# Patient Record
Sex: Female | Born: 1970 | Race: Black or African American | Hispanic: No | Marital: Single | Smoking: Never smoker
Health system: Southern US, Community
[De-identification: ages and names within clinical notes are randomized; demographics above are authoritative.]

---

## 1997-10-08 ENCOUNTER — Emergency Department (HOSPITAL_COMMUNITY): Admission: EM | Admit: 1997-10-08 | Discharge: 1997-10-09 | Payer: Self-pay | Admitting: Emergency Medicine

## 1998-01-29 ENCOUNTER — Other Ambulatory Visit: Admission: RE | Admit: 1998-01-29 | Discharge: 1998-01-29 | Payer: Self-pay | Admitting: Obstetrics and Gynecology

## 2001-03-15 ENCOUNTER — Other Ambulatory Visit: Admission: RE | Admit: 2001-03-15 | Discharge: 2001-03-15 | Payer: Self-pay | Admitting: Family Medicine

## 2001-03-17 ENCOUNTER — Encounter: Admission: RE | Admit: 2001-03-17 | Discharge: 2001-03-17 | Payer: Self-pay | Admitting: Family Medicine

## 2001-03-17 ENCOUNTER — Encounter: Payer: Self-pay | Admitting: Family Medicine

## 2002-04-08 ENCOUNTER — Encounter: Payer: Self-pay | Admitting: Ophthalmology

## 2002-04-08 ENCOUNTER — Encounter: Admission: RE | Admit: 2002-04-08 | Discharge: 2002-04-08 | Payer: Self-pay | Admitting: Ophthalmology

## 2002-06-01 ENCOUNTER — Ambulatory Visit (HOSPITAL_COMMUNITY): Admission: RE | Admit: 2002-06-01 | Discharge: 2002-06-01 | Payer: Self-pay | Admitting: Internal Medicine

## 2002-10-28 ENCOUNTER — Ambulatory Visit (HOSPITAL_COMMUNITY): Admission: RE | Admit: 2002-10-28 | Discharge: 2002-10-28 | Payer: Self-pay | Admitting: Internal Medicine

## 2003-03-16 ENCOUNTER — Other Ambulatory Visit: Admission: RE | Admit: 2003-03-16 | Discharge: 2003-03-16 | Payer: Self-pay | Admitting: Obstetrics and Gynecology

## 2003-03-21 ENCOUNTER — Encounter: Admission: RE | Admit: 2003-03-21 | Discharge: 2003-03-21 | Payer: Self-pay | Admitting: Obstetrics and Gynecology

## 2017-11-13 ENCOUNTER — Encounter: Payer: Self-pay | Admitting: Family

## 2017-11-13 ENCOUNTER — Ambulatory Visit (INDEPENDENT_AMBULATORY_CARE_PROVIDER_SITE_OTHER): Payer: BC Managed Care – PPO | Admitting: Family

## 2017-11-13 DIAGNOSIS — D509 Iron deficiency anemia, unspecified: Secondary | ICD-10-CM

## 2017-11-13 NOTE — Patient Instructions (Addendum)
Please consider scheduling follow-up with your eye doctor and dentist; please sign a records release from your GYN;  Health Maintenance, Female Adopting a healthy lifestyle and getting preventive care can go a long way to promote health and wellness. Talk with your health care provider about what schedule of regular examinations is right for you. This is a good chance for you to check in with your provider about disease prevention and staying healthy. In between checkups, there are plenty of things you can do on your own. Experts have done a lot of research about which lifestyle changes and preventive measures are most likely to keep you healthy. Ask your health care provider for more information. Weight and diet Eat a healthy diet  Be sure to include plenty of vegetables, fruits, low-fat dairy products, and lean protein.  Do not eat a lot of foods high in solid fats, added sugars, or salt.  Get regular exercise. This is one of the most important things you can do for your health. ? Most adults should exercise for at least 150 minutes each week. The exercise should increase your heart rate and make you sweat (moderate-intensity exercise). ? Most adults should also do strengthening exercises at least twice a week. This is in addition to the moderate-intensity exercise.  Maintain a healthy weight  Body mass index (BMI) is a measurement that can be used to identify possible weight problems. It estimates body fat based on height and weight. Your health care provider can help determine your BMI and help you achieve or maintain a healthy weight.  For females 22 years of age and older: ? A BMI below 18.5 is considered underweight. ? A BMI of 18.5 to 24.9 is normal. ? A BMI of 25 to 29.9 is considered overweight. ? A BMI of 30 and above is considered obese.  Watch levels of cholesterol and blood lipids  You should start having your blood tested for lipids and cholesterol at 49 years of age, then  have this test every 5 years.  You may need to have your cholesterol levels checked more often if: ? Your lipid or cholesterol levels are high. ? You are older than 49 years of age. ? You are at high risk for heart disease.  Cancer screening Lung Cancer  Lung cancer screening is recommended for adults 87-59 years old who are at high risk for lung cancer because of a history of smoking.  A yearly low-dose CT scan of the lungs is recommended for people who: ? Currently smoke. ? Have quit within the past 15 years. ? Have at least a 30-pack-year history of smoking. A pack year is smoking an average of one pack of cigarettes a day for 1 year.  Yearly screening should continue until it has been 15 years since you quit.  Yearly screening should stop if you develop a health problem that would prevent you from having lung cancer treatment.  Breast Cancer  Practice breast self-awareness. This means understanding how your breasts normally appear and feel.  It also means doing regular breast self-exams. Let your health care provider know about any changes, no matter how small.  If you are in your 20s or 30s, you should have a clinical breast exam (CBE) by a health care provider every 1-3 years as part of a regular health exam.  If you are 63 or older, have a CBE every year. Also consider having a breast X-ray (mammogram) every year.  If you have a family history  of breast cancer, talk to your health care provider about genetic screening.  If you are at high risk for breast cancer, talk to your health care provider about having an MRI and a mammogram every year.  Breast cancer gene (BRCA) assessment is recommended for women who have family members with BRCA-related cancers. BRCA-related cancers include: ? Breast. ? Ovarian. ? Tubal. ? Peritoneal cancers.  Results of the assessment will determine the need for genetic counseling and BRCA1 and BRCA2 testing.  Cervical Cancer Your health  care provider may recommend that you be screened regularly for cancer of the pelvic organs (ovaries, uterus, and vagina). This screening involves a pelvic examination, including checking for microscopic changes to the surface of your cervix (Pap test). You may be encouraged to have this screening done every 3 years, beginning at age 32.  For women ages 93-65, health care providers may recommend pelvic exams and Pap testing every 3 years, or they may recommend the Pap and pelvic exam, combined with testing for human papilloma virus (HPV), every 5 years. Some types of HPV increase your risk of cervical cancer. Testing for HPV may also be done on women of any age with unclear Pap test results.  Other health care providers may not recommend any screening for nonpregnant women who are considered low risk for pelvic cancer and who do not have symptoms. Ask your health care provider if a screening pelvic exam is right for you.  If you have had past treatment for cervical cancer or a condition that could lead to cancer, you need Pap tests and screening for cancer for at least 20 years after your treatment. If Pap tests have been discontinued, your risk factors (such as having a new sexual partner) need to be reassessed to determine if screening should resume. Some women have medical problems that increase the chance of getting cervical cancer. In these cases, your health care provider may recommend more frequent screening and Pap tests.  Colorectal Cancer  This type of cancer can be detected and often prevented.  Routine colorectal cancer screening usually begins at 49 years of age and continues through 49 years of age.  Your health care provider may recommend screening at an earlier age if you have risk factors for colon cancer.  Your health care provider may also recommend using home test kits to check for hidden blood in the stool.  A small camera at the end of a tube can be used to examine your colon  directly (sigmoidoscopy or colonoscopy). This is done to check for the earliest forms of colorectal cancer.  Routine screening usually begins at age 45.  Direct examination of the colon should be repeated every 5-10 years through 49 years of age. However, you may need to be screened more often if early forms of precancerous polyps or small growths are found.  Skin Cancer  Check your skin from head to toe regularly.  Tell your health care provider about any new moles or changes in moles, especially if there is a change in a mole's shape or color.  Also tell your health care provider if you have a mole that is larger than the size of a pencil eraser.  Always use sunscreen. Apply sunscreen liberally and repeatedly throughout the day.  Protect yourself by wearing long sleeves, pants, a wide-brimmed hat, and sunglasses whenever you are outside.  Heart disease, diabetes, and high blood pressure  High blood pressure causes heart disease and increases the risk of stroke. High  blood pressure is more likely to develop in: ? People who have blood pressure in the high end of the normal range (130-139/85-89 mm Hg). ? People who are overweight or obese. ? People who are African American.  If you are 90-2 years of age, have your blood pressure checked every 3-5 years. If you are 53 years of age or older, have your blood pressure checked every year. You should have your blood pressure measured twice-once when you are at a hospital or clinic, and once when you are not at a hospital or clinic. Record the average of the two measurements. To check your blood pressure when you are not at a hospital or clinic, you can use: ? An automated blood pressure machine at a pharmacy. ? A home blood pressure monitor.  If you are between 37 years and 1 years old, ask your health care provider if you should take aspirin to prevent strokes.  Have regular diabetes screenings. This involves taking a blood sample to  check your fasting blood sugar level. ? If you are at a normal weight and have a low risk for diabetes, have this test once every three years after 49 years of age. ? If you are overweight and have a high risk for diabetes, consider being tested at a younger age or more often. Preventing infection Hepatitis B  If you have a higher risk for hepatitis B, you should be screened for this virus. You are considered at high risk for hepatitis B if: ? You were born in a country where hepatitis B is common. Ask your health care provider which countries are considered high risk. ? Your parents were born in a high-risk country, and you have not been immunized against hepatitis B (hepatitis B vaccine). ? You have HIV or AIDS. ? You use needles to inject street drugs. ? You live with someone who has hepatitis B. ? You have had sex with someone who has hepatitis B. ? You get hemodialysis treatment. ? You take certain medicines for conditions, including cancer, organ transplantation, and autoimmune conditions.  Hepatitis C  Blood testing is recommended for: ? Everyone born from [birth year] through 1967. ? Anyone with known risk factors for hepatitis C.  Sexually transmitted infections (STIs)  You should be screened for sexually transmitted infections (STIs) including gonorrhea and chlamydia if: ? You are sexually active and are younger than 48 years of age. ? You are older than 49 years of age and your health care provider tells you that you are at risk for this type of infection. ? Your sexual activity has changed since you were last screened and you are at an increased risk for chlamydia or gonorrhea. Ask your health care provider if you are at risk.  If you do not have HIV, but are at risk, it may be recommended that you take a prescription medicine daily to prevent HIV infection. This is called pre-exposure prophylaxis (PrEP). You are considered at risk if: ? You are sexually active and do not regularly  use condoms or know the HIV status of your partner(s). ? You take drugs by injection. ? You are sexually active with a partner who has HIV.  Talk with your health care provider about whether you are at high risk of being infected with HIV. If you choose to begin PrEP, you should first be tested for HIV. You should then be tested every 3 months for as long as you are taking PrEP. Pregnancy  If you are  premenopausal and you may become pregnant, ask your health care provider about preconception counseling.  If you may become pregnant, take 400 to 800 micrograms (mcg) of folic acid every day.  If you want to prevent pregnancy, talk to your health care provider about birth control (contraception). Osteoporosis and menopause  Osteoporosis is a disease in which the bones lose minerals and strength with aging. This can result in serious bone fractures. Your risk for osteoporosis can be identified using a bone density scan.  If you are 23 years of age or older, or if you are at risk for osteoporosis and fractures, ask your health care provider if you should be screened.  Ask your health care provider whether you should take a calcium or vitamin D supplement to lower your risk for osteoporosis.  Menopause may have certain physical symptoms and risks.  Hormone replacement therapy may reduce some of these symptoms and risks. Talk to your health care provider about whether hormone replacement therapy is right for you. Follow these instructions at home:  Schedule regular health, dental, and eye exams.  Stay current with your immunizations.  Do not use any tobacco products including cigarettes, chewing tobacco, or electronic cigarettes.  If you are pregnant, do not drink alcohol.  If you are breastfeeding, limit how much and how often you drink alcohol.  Limit alcohol intake to no more than 1 drink per day for nonpregnant women. One drink equals 12 ounces of beer, 5 ounces of wine, or 1 ounces  of hard liquor.  Do not use street drugs.  Do not share needles.  Ask your health care provider for help if you need support or information about quitting drugs.  Tell your health care provider if you often feel depressed.  Tell your health care provider if you have ever been abused or do not feel safe at home. This information is not intended to replace advice given to you by your health care provider. Make sure you discuss any questions you have with your health care provider. Document Released: 11/04/2010 Document Revised: 09/27/2015 Document Reviewed: 01/23/2015 Elsevier Interactive Patient Education  [employer].

## 2017-11-13 NOTE — Progress Notes (Signed)
  Susan Morton is a 47 y.o. female with the following history as recorded in [facility]:  Patient Active Problem List   Diagnosis Date Noted  . Iron deficiency anemia 11/13/2017    No current outpatient medications on file.   No current facility-administered medications for this visit.     Allergies: Patient has no allergy information on record.  History reviewed. No pertinent past medical history.  History reviewed. No pertinent surgical history.  Family History  Problem Relation Age of Onset  . Heart disease Mother   . Hypertension Father     Social History   Tobacco Use  . Smoking status: Never Smoker  . Smokeless tobacco: Never Used  Substance Use Topics  . Alcohol use: Yes    Comment: 1 glass of wine per week    Subjective:  Patient presents today as a new patient. She is in her baseline state of health with no concerns. She recently had her CPE with her GYN who has been managing all of her healthcare needs. The GYN has been doing pap smears, mammogram and yearly labs. She will get those results forwarded for review but notes that she was told that all of her recent labs were fine. She notes her GYN told her that she needed to have a primary care provider in addition to her GYN. Patient defers any vaccines today; does not currently taking any type of medication; has been told to take iron in the past but is not currently taking her supplement. Notes that she has not had a period in almost 5 months and that her anemia had improved as a result with her last set of labs.  Admits she is overdue for dental and vision care; does not exercise regularly; denies any concerns for sleep problems;   Objective:  Vitals:   11/13/17 0953  BP: 138/86  Pulse: 91  Temp: 97.7 F (36.5 C)  TempSrc: Oral  SpO2: 99%  Weight: 207 lb 3.2 oz (94 kg)  Height: 5' (1.524 m)    General: Well developed, well nourished, in no acute distress  Skin : Warm and dry.  Head: Normocephalic and atraumatic   Lungs: Respirations unlabored; clear to auscultation bilaterally without wheeze, rales, rhonchi  [facility] exam: normal rate and regular rhythm.  Neurologic: Alert and oriented; speech intact; face symmetrical; moves all extremities well; CNII-XII intact without focal deficit  Assessment:  1. Iron deficiency anemia, unspecified iron deficiency anemia type     Plan:  Patient will have copies of her recent labs forwarded for review here; encouraged exercise/ weight loss; encouraged eye doctor and dental exams; she defers updating vaccines today; explained how PCP can work with her GYN to manage her healthcare needs.  No follow-ups on file.  No orders of the defined types were placed in this encounter.   Requested Prescriptions    No prescriptions requested or ordered in this encounter

## 2018-12-10 ENCOUNTER — Other Ambulatory Visit: Payer: Self-pay | Admitting: Family

## 2018-12-10 DIAGNOSIS — Z1231 Encounter for screening mammogram for malignant neoplasm of breast: Secondary | ICD-10-CM

## 2018-12-24 ENCOUNTER — Encounter: Payer: BC Managed Care – PPO | Admitting: Family

## 2019-02-04 ENCOUNTER — Ambulatory Visit: Payer: BC Managed Care – PPO

## 2019-08-22 ENCOUNTER — Other Ambulatory Visit: Payer: Self-pay

## 2019-08-22 ENCOUNTER — Encounter: Payer: Self-pay | Admitting: Family

## 2019-08-22 ENCOUNTER — Ambulatory Visit (INDEPENDENT_AMBULATORY_CARE_PROVIDER_SITE_OTHER): Payer: BC Managed Care – PPO | Admitting: Family

## 2019-08-22 VITALS — BP 138/102 | HR 107 | Temp 98.8°F | Wt 222.0 lb

## 2019-08-22 DIAGNOSIS — R030 Elevated blood-pressure reading, without diagnosis of hypertension: Secondary | ICD-10-CM

## 2019-08-22 DIAGNOSIS — L739 Follicular disorder, unspecified: Secondary | ICD-10-CM | POA: Diagnosis not present

## 2019-08-22 MED ORDER — DOXYCYCLINE HYCLATE 100 MG PO TABS: 100.0000 mg | ORAL_TABLET | Freq: Two times a day (BID) | ORAL | 0 refills | Status: DC

## 2019-08-22 NOTE — Patient Instructions (Signed)
Please apply warm/ moist compresses ( warm washcloth) to your right armpit 4 x per day as tolerated; this will help the area open up; please take all the antibiotics as prescribed;  Please start checking your blood pressure- try to check it 2-3 x per week for the next 2 weeks; please follow-up to re-check/ discuss in 2 weeks;   Folliculitis  Folliculitis is inflammation of the hair follicles. Folliculitis most commonly occurs on the scalp, thighs, legs, back, and buttocks. However, it can occur anywhere on the body. What are the causes? This condition may be caused by:  A bacterial infection (common).  A fungal infection.  A viral infection.  Contact with certain chemicals, especially oils and tars.  Shaving or waxing.  Greasy ointments or creams applied to the skin. Long-lasting folliculitis and folliculitis that keeps coming back may be caused by bacteria. This bacteria can live anywhere on your skin and is often found in the nostrils. What increases the risk? You are more likely to develop this condition if you have:  A weakened immune system.  Diabetes.  Obesity. What are the signs or symptoms? Symptoms of this condition include:  Redness.  Soreness.  Swelling.  Itching.  Small white or yellow, pus-filled, itchy spots (pustules) that appear over a reddened area. If there is an infection that goes deep into the follicle, these may develop into a boil (furuncle).  A group of closely packed boils (carbuncle). These tend to form in hairy, sweaty areas of the body. How is this diagnosed? This condition is diagnosed with a skin exam. To find what is causing the condition, your health care provider may take a sample of one of the pustules or boils for testing in a lab. How is this treated? This condition may be treated by:  Applying warm compresses to the affected areas.  Taking an antibiotic medicine or applying an antibiotic medicine to the skin.  Applying or  bathing with an antiseptic solution.  Taking an over-the-counter medicine to help with itching.  Having a procedure to drain any pustules or boils. This may be done if a pustule or boil contains a lot of pus or fluid.  Having laser hair removal. This may be done to treat long-lasting folliculitis. Follow these instructions at home: Managing pain and swelling   If directed, apply heat to the affected area as often as told by your health care provider. Use the heat source that your health care provider recommends, such as a moist heat pack or a heating pad. ? Place a towel between your skin and the heat source. ? Leave the heat on for 20-30 minutes. ? Remove the heat if your skin turns bright red. This is especially important if you are unable to feel pain, heat, or cold. You may have a greater risk of getting burned. General instructions  If you were prescribed an antibiotic medicine, take it or apply it as told by your health care provider. Do not stop using the antibiotic even if your condition improves.  Check the irritated area every day for signs of infection. Check for: ? Redness, swelling, or pain. ? Fluid or blood. ? Warmth. ? Pus or a bad smell.  Do not shave irritated skin.  Take over-the-counter and prescription medicines only as told by your health care provider.  Keep all follow-up visits as told by your health care provider. This is important. Get help right away if:  You have more redness, swelling, or pain in the affected  area.  Red streaks are spreading from the affected area.  You have a fever. Summary  Folliculitis is inflammation of the hair follicles. Folliculitis most commonly occurs on the scalp, thighs, legs, back, and buttocks.  This condition may be treated by taking an antibiotic medicine or applying an antibiotic medicine to the skin, and applying or bathing with an antiseptic solution.  If you were prescribed an antibiotic medicine, take it or  apply it as told by your health care provider. Do not stop using the antibiotic even if your condition improves.  Get help right away if you have new or worsening symptoms.  Keep all follow-up visits as told by your health care provider. This is important. This information is not intended to replace advice given to you by your health care provider. Make sure you discuss any questions you have with your health care provider. Document Revised: 11/28/2017 Document Reviewed: 11/28/2017 Elsevier Patient Education  [place].

## 2019-08-22 NOTE — Progress Notes (Signed)
  Susan Morton is a 49 y.o. female with the following history as recorded in Epic[facility]:  Patient Active Problem List   Diagnosis Date Noted  . Iron deficiency anemia 11/13/2017    Current Outpatient Medications  Medication Sig Dispense Refill  . doxycycline (VIBRA-TABS) 100 MG tablet Take 1 tablet (100 mg total) by mouth 2 (two) times daily. 20 tablet 0   No current facility-administered medications for this visit.    Allergies: Patient has no known allergies.  No past medical history on file.  No past surgical history on file.  Family History  Problem Relation Age of Onset  . Heart disease Mother   . Hypertension Father     Social History   Tobacco Use  . Smoking status: Never Smoker  . Smokeless tobacco: Never Used  Substance Use Topics  . Alcohol use: Yes    Comment: 1 glass of wine per week    Subjective:  Patient has been seen here once in 2019; majority of her healthcare needs are managed by her GYN; Notes that she shaved under her right armpit last week- became concerned when area of concern seemed to get larger over the weekend; is up to date on mammogram- normal mammogram was done in October  2020 with her GYN;   Has gained 15 pounds since OV in 2019- no prior history of hypertension; patient feels like her blood pressure is elevated today due to rushing to get here from work;   Objective:  Vitals:   08/22/19 1425  BP: (!) 138/102  Pulse: (!) 107  Temp: 98.8 F (37.1 C)  TempSrc: Oral  SpO2: 95%  Weight: 222 lb (100.7 kg)    General: Well developed, well nourished, in no acute distress  Skin : Warm and dry. Localized area of folliculitis in right axillary region Head: Normocephalic and atraumatic  Lungs: Respirations unlabored;  Neurologic: Alert and oriented; speech intact; face symmetrical; moves all extremities well; CNII-XII intact without focal deficit  Breast exam is normal;  Assessment:  1. Folliculitis of right axilla   2. Elevated blood pressure  reading     Plan:  1. Reassurance- do not think diagnostic mammogram is needed; encouraged to apply warm compresses up to 4 x per day; Rx for Doxycycline 100 mg bid x 10 days; follow-up worse, no better. 2. Patient to start checking her blood pressure regularly- follow-up in 2 weeks and bring readings for review; may need to start medication;  This visit occurred during the SARS-CoV-2 public health emergency.  Safety protocols were in place, including screening questions prior to the visit, additional usage of staff PPE, and extensive cleaning of exam room while observing appropriate contact time as indicated for disinfecting solutions.     No follow-ups on file.  No orders of the defined types were placed in this encounter.   Requested Prescriptions   Signed Prescriptions Disp Refills  . doxycycline (VIBRA-TABS) 100 MG tablet 20 tablet 0    Sig: Take 1 tablet (100 mg total) by mouth 2 (two) times daily.

## 2019-09-05 ENCOUNTER — Ambulatory Visit (INDEPENDENT_AMBULATORY_CARE_PROVIDER_SITE_OTHER): Payer: BC Managed Care – PPO | Admitting: Family

## 2019-09-05 ENCOUNTER — Encounter: Payer: Self-pay | Admitting: Family

## 2019-09-05 ENCOUNTER — Other Ambulatory Visit: Payer: Self-pay

## 2019-09-05 VITALS — BP 162/104 | HR 97 | Temp 98.7°F | Ht 60.0 in | Wt 223.8 lb

## 2019-09-05 DIAGNOSIS — Z862 Personal history of diseases of the blood and blood-forming organs and certain disorders involving the immune mechanism: Secondary | ICD-10-CM | POA: Diagnosis not present

## 2019-09-05 DIAGNOSIS — D509 Iron deficiency anemia, unspecified: Secondary | ICD-10-CM

## 2019-09-05 DIAGNOSIS — I10 Essential (primary) hypertension: Secondary | ICD-10-CM | POA: Diagnosis not present

## 2019-09-05 MED ORDER — AMLODIPINE BESYLATE 5 MG PO TABS: 5.0000 mg | ORAL_TABLET | Freq: Every day | ORAL | 0 refills | Status: DC

## 2019-09-05 NOTE — Patient Instructions (Signed)

## 2019-09-05 NOTE — Progress Notes (Signed)
  Susan Morton is a 49 y.o. female with the following history as recorded in EpicCare:  Patient Active Problem List   Diagnosis Date Noted  . Iron deficiency anemia 11/13/2017    Current Outpatient Medications  Medication Sig Dispense Refill  . amLODipine (NORVASC) 5 MG tablet Take 1 tablet (5 mg total) by mouth daily. 30 tablet 0   No current facility-administered medications for this visit.    Allergies: Patient has no known allergies.  History reviewed. No pertinent past medical history.  History reviewed. No pertinent surgical history.  Family History  Problem Relation Age of Onset  . Heart disease Mother   . Hypertension Father     Social History   Tobacco Use  . Smoking status: Never Smoker  . Smokeless tobacco: Never Used  Substance Use Topics  . Alcohol use: Yes    Comment: 1 glass of wine per week    Subjective:  2 week follow up on elevated blood pressure; has not been checking as discussed at last office visit; has been told by her GYN that pressure has been elevated in the past; strong FH of HTN; Denies any chest pain, shortness of breath, blurred vision or headache   Objective:  Vitals:   09/05/19 1559 09/05/19 1602  BP: (!) 160/100 (!) 162/104  Pulse: 97   Temp: 98.7 F (37.1 C)   TempSrc: Oral   SpO2: 98%   Weight: 223 lb 12.8 oz (101.5 kg)   Height: 5' (1.524 m)     General: Well developed, well nourished, in no acute distress  Skin : Warm and dry.  Head: Normocephalic and atraumatic  Lungs: Respirations unlabored; clear to auscultation bilaterally without wheeze, rales, rhonchi  CVS exam: normal rate and regular rhythm.  Neurologic: Alert and oriented; speech intact; face symmetrical; moves all extremities well; CNII-XII intact without focal deficit   Assessment:  1. Essential hypertension   2. Personal history of sarcoidosis   3. Iron deficiency anemia, unspecified iron deficiency anemia type     Plan:  1. Start Amlodipine 5 mg daily; risks/  benefits of medication discussed; written information provided; plan to update labs at next OV; follow-up in 2 weeks; 2. Patient will try to find old records to see who she may have been working with in the past; at next OV, plan to update CXR and ACE level; 3. Will update anemia panel- this has been managed by her GYN previously;  This visit occurred during the SARS-CoV-2 public health emergency.  Safety protocols were in place, including screening questions prior to the visit, additional usage of staff PPE, and extensive cleaning of exam room while observing appropriate contact time as indicated for disinfecting solutions.     No follow-ups on file.  No orders of the defined types were placed in this encounter.   Requested Prescriptions   Signed Prescriptions Disp Refills  . amLODipine (NORVASC) 5 MG tablet 30 tablet 0    Sig: Take 1 tablet (5 mg total) by mouth daily.

## 2019-09-19 ENCOUNTER — Ambulatory Visit: Payer: BC Managed Care – PPO | Admitting: Family

## 2019-09-19 ENCOUNTER — Encounter: Payer: Self-pay | Admitting: Family

## 2019-09-19 ENCOUNTER — Other Ambulatory Visit: Payer: Self-pay

## 2019-09-19 VITALS — BP 140/88 | HR 94 | Temp 98.0°F | Ht 60.0 in | Wt 222.2 lb

## 2019-09-19 DIAGNOSIS — D509 Iron deficiency anemia, unspecified: Secondary | ICD-10-CM | POA: Diagnosis not present

## 2019-09-19 DIAGNOSIS — E569 Vitamin deficiency, unspecified: Secondary | ICD-10-CM

## 2019-09-19 DIAGNOSIS — R5383 Other fatigue: Secondary | ICD-10-CM | POA: Diagnosis not present

## 2019-09-19 DIAGNOSIS — D869 Sarcoidosis, unspecified: Secondary | ICD-10-CM

## 2019-09-19 DIAGNOSIS — Z13220 Encounter for screening for lipoid disorders: Secondary | ICD-10-CM

## 2019-09-19 DIAGNOSIS — I10 Essential (primary) hypertension: Secondary | ICD-10-CM

## 2019-09-19 LAB — CBC WITH DIFFERENTIAL/PLATELET
Basophils Absolute: 0 10*3/uL (ref 0.0–0.1)
Basophils Relative: 1 % (ref 0.0–3.0)
Eosinophils Absolute: 0.1 10*3/uL (ref 0.0–0.7)
Eosinophils Relative: 2.2 % (ref 0.0–5.0)
HCT: 42 % (ref 36.0–46.0)
Hemoglobin: 14 g/dL (ref 12.0–15.0)
Lymphocytes Relative: 36.2 % (ref 12.0–46.0)
Lymphs Abs: 1.5 10*3/uL (ref 0.7–4.0)
MCHC: 33.4 g/dL (ref 30.0–36.0)
MCV: 92.7 fl (ref 78.0–100.0)
Monocytes Absolute: 0.3 10*3/uL (ref 0.1–1.0)
Monocytes Relative: 6.9 % (ref 3.0–12.0)
Neutro Abs: 2.3 10*3/uL (ref 1.4–7.7)
Neutrophils Relative %: 53.7 % (ref 43.0–77.0)
Platelets: 287 10*3/uL (ref 150.0–400.0)
RBC: 4.53 Mil/uL (ref 3.87–5.11)
RDW: 13.8 % (ref 11.5–15.5)
WBC: 4.2 10*3/uL (ref 4.0–10.5)

## 2019-09-19 LAB — COMPREHENSIVE METABOLIC PANEL
ALT: 11 U/L (ref 0–35)
AST: 18 U/L (ref 0–37)
Albumin: 4.3 g/dL (ref 3.5–5.2)
Alkaline Phosphatase: 71 U/L (ref 39–117)
BUN: 20 mg/dL (ref 6–23)
CO2: 27 mEq/L (ref 19–32)
Calcium: 9.3 mg/dL (ref 8.4–10.5)
Chloride: 103 mEq/L (ref 96–112)
Creatinine, Ser: 0.72 mg/dL (ref 0.40–1.20)
GFR: 103.26 mL/min (ref >60.00)
Glucose, Bld: 97 mg/dL (ref 70–99)
Potassium: 3.9 mEq/L (ref 3.5–5.1)
Sodium: 136 mEq/L (ref 135–145)
Total Bilirubin: 0.4 mg/dL (ref 0.2–1.2)
Total Protein: 8.4 g/dL — ABNORMAL HIGH (ref 6.0–8.3)

## 2019-09-19 LAB — LIPID PANEL
Cholesterol: 219 mg/dL — ABNORMAL HIGH (ref 0–200)
HDL: 55.3 mg/dL (ref >39.00)
LDL Cholesterol: 142 mg/dL — ABNORMAL HIGH (ref 0–99)
NonHDL: 163.52
Total CHOL/HDL Ratio: 4
Triglycerides: 108 mg/dL (ref 0.0–149.0)
VLDL: 21.6 mg/dL (ref 0.0–40.0)

## 2019-09-19 LAB — SEDIMENTATION RATE: Sed Rate: 32 mm/hr — ABNORMAL HIGH (ref 0–30)

## 2019-09-19 MED ORDER — AMLODIPINE BESYLATE 10 MG PO TABS: 10.0000 mg | ORAL_TABLET | Freq: Every day | ORAL | 2 refills | Status: DC

## 2019-09-19 NOTE — Progress Notes (Signed)
Susan Morton is a 49 y.o. female with the following history as recorded in EpicCare:  Patient Active Problem List   Diagnosis Date Noted  . Iron deficiency anemia 11/13/2017    Current Outpatient Medications  Medication Sig Dispense Refill  . amLODipine (NORVASC) 5 MG tablet Take 1 tablet (5 mg total) by mouth daily. 30 tablet 0  . amLODipine (NORVASC) 10 MG tablet Take 1 tablet (10 mg total) by mouth daily. 30 tablet 2   No current facility-administered medications for this visit.    Allergies: Patient has no known allergies.  History reviewed. No pertinent past medical history.  History reviewed. No pertinent surgical history.  Family History  Problem Relation Age of Onset  . Heart disease Mother   . Hypertension Father     Social History   Tobacco Use  . Smoking status: Never Smoker  . Smokeless tobacco: Never Used  Substance Use Topics  . Alcohol use: Yes    Comment: 1 glass of wine per week    Subjective:  Follow-up on recent start of Amlodipine 5 mg; tolerating well and is "feeling so much better." Has noticed increased energy and sleeping better; Denies any chest pain, shortness of breath, blurred vision or headache Did find old records regarding the diagnosis of sarcoidosis; was originally discussed in relation to iritis diagnosis in 2003; she notes that she felt much better and opted not to continue regular follow-ups; Has also been told by her GYN that she is anemic at previous check-ups; overdue for baseline colon cancer screening;  Objective:  Vitals:   09/19/19 1544  BP: 140/88  Pulse: 94  Temp: 98 F (36.7 C)  TempSrc: Oral  SpO2: 99%  Weight: 222 lb 3.2 oz (100.8 kg)  Height: 5' (1.524 m)    General: Well developed, well nourished, in no acute distress  Skin : Warm and dry.  Head: Normocephalic and atraumatic  Lungs: Respirations unlabored; clear to auscultation bilaterally without wheeze, rales, rhonchi  CVS exam: normal rate and regular rhythm.   Neurologic: Alert and oriented; speech intact; face symmetrical; moves all extremities well; CNII-XII intact without focal deficit   Assessment:  1. Essential hypertension   2. Iron deficiency anemia, unspecified iron deficiency anemia type   3. Lipid screening   4. Other fatigue   5. Vitamin deficiency   6. Sarcoidosis     Plan:  1. Improved; increase Amlodipine to 10 mg daily; re-check in 1 month; 2. Check iron panel today; need to discuss colon cancer screening at next OV; 3. Check lipid panel today; 4. Check TSH today; 5. Check Vitamin D level today; 6. Check ACE level, ANA, sed rate, RF today; will get CXR at next OV as our machine is not working today;  This visit occurred during the SARS-CoV-2 public health emergency.  Safety protocols were in place, including screening questions prior to the visit, additional usage of staff PPE, and extensive cleaning of exam room while observing appropriate contact time as indicated for disinfecting solutions.     Return in about 1 month (around 10/20/2019) for needs Friday appointment.  Orders Placed This Encounter  Procedures  . CBC with Differential/Platelet  . Comp Met (CMET)  . Iron, TIBC and Ferritin Panel  . Lipid panel  . TSH  . Vitamin D (25 hydroxy)  . Antinuclear Antib (ANA)  . Sedimentation rate  . Rheumatoid Factor  . Angiotensin converting enzyme    Requested Prescriptions   Signed Prescriptions Disp Refills  .  amLODipine (NORVASC) 10 MG tablet 30 tablet 2    Sig: Take 1 tablet (10 mg total) by mouth daily.

## 2019-09-20 LAB — VITAMIN D 25 HYDROXY (VIT D DEFICIENCY, FRACTURES): VITD: 24.6 ng/mL — ABNORMAL LOW (ref 30.00–100.00)

## 2019-09-20 LAB — TSH: TSH: 1.37 u[IU]/mL (ref 0.35–4.50)

## 2019-09-22 LAB — ANGIOTENSIN CONVERTING ENZYME: Angiotensin-Converting Enzyme: 45 U/L (ref 9–67)

## 2019-09-22 LAB — EXTRA LAV TOP TUBE

## 2019-09-22 LAB — ANTI-NUCLEAR AB-TITER (ANA TITER)
ANA TITER: 1:80 {titer} — ABNORMAL HIGH
ANA Titer 1: >=1:1280 {titer} — AB

## 2019-09-22 LAB — RHEUMATOID FACTOR: Rheumatoid fact SerPl-aCnc: <14 IU/mL (ref <14)

## 2019-09-22 LAB — IRON,TIBC AND FERRITIN PANEL
%SAT: 13 % (calc) — ABNORMAL LOW (ref 16–45)
Ferritin: 14 ng/mL — ABNORMAL LOW (ref 16–232)
Iron: 64 ug/dL (ref 45–160)
TIBC: 499 mcg/dL (calc) — ABNORMAL HIGH (ref 250–450)

## 2019-09-22 LAB — ANA: Anti Nuclear Antibody (ANA): POSITIVE — AB

## 2019-09-23 ENCOUNTER — Other Ambulatory Visit: Payer: Self-pay | Admitting: Family

## 2019-09-23 DIAGNOSIS — R768 Other specified abnormal immunological findings in serum: Secondary | ICD-10-CM

## 2019-09-23 DIAGNOSIS — D869 Sarcoidosis, unspecified: Secondary | ICD-10-CM

## 2019-09-23 MED ORDER — VITAMIN D (ERGOCALCIFEROL) 1.25 MG (50000 UNIT) PO CAPS: 50000.0000 [IU] | ORAL_CAPSULE | ORAL | 0 refills | Status: AC

## 2019-09-27 ENCOUNTER — Other Ambulatory Visit: Payer: Self-pay | Admitting: Family

## 2019-10-21 ENCOUNTER — Other Ambulatory Visit: Payer: Self-pay

## 2019-10-21 ENCOUNTER — Ambulatory Visit (INDEPENDENT_AMBULATORY_CARE_PROVIDER_SITE_OTHER): Payer: BC Managed Care – PPO | Admitting: Family

## 2019-10-21 ENCOUNTER — Encounter: Payer: Self-pay | Admitting: Gastroenterology

## 2019-10-21 ENCOUNTER — Encounter: Payer: Self-pay | Admitting: Family

## 2019-10-21 VITALS — BP 138/84 | HR 89 | Temp 98.2°F | Ht 60.0 in | Wt 220.0 lb

## 2019-10-21 DIAGNOSIS — D869 Sarcoidosis, unspecified: Secondary | ICD-10-CM

## 2019-10-21 DIAGNOSIS — Z1211 Encounter for screening for malignant neoplasm of colon: Secondary | ICD-10-CM

## 2019-10-21 DIAGNOSIS — I10 Essential (primary) hypertension: Secondary | ICD-10-CM

## 2019-10-21 NOTE — Progress Notes (Signed)
  Susan Morton is a 49 y.o. female with the following history as recorded in EpicCare:  Patient Active Problem List   Diagnosis Date Noted  . Iron deficiency anemia 11/13/2017    Current Outpatient Medications  Medication Sig Dispense Refill  . amLODipine (NORVASC) 10 MG tablet Take 1 tablet (10 mg total) by mouth daily. 30 tablet 2  . Vitamin D, Ergocalciferol, (DRISDOL) 1.25 MG (50000 UNIT) CAPS capsule Take 1 capsule (50,000 Units total) by mouth every 7 (seven) days for 12 doses. 12 capsule 0   No current facility-administered medications for this visit.    Allergies: Patient has no known allergies.  History reviewed. No pertinent past medical history.  History reviewed. No pertinent surgical history.  Family History  Problem Relation Age of Onset  . Heart disease Mother   . Hypertension Father     Social History   Tobacco Use  . Smoking status: Never Smoker  . Smokeless tobacco: Never Used  Substance Use Topics  . Alcohol use: Yes    Comment: 1 glass of wine per week    Subjective:  1 month follow-up on hypertension; feeling very good since she was started on Amlodipine; at last OV, dosage was increased to 10 mg; Denies any chest pain, shortness of breath, blurred vision or headache    Objective:  Vitals:   10/21/19 0920  BP: 138/84  Pulse: 89  Temp: 98.2 F (36.8 C)  TempSrc: Oral  SpO2: 96%  Weight: 220 lb (99.8 kg)  Height: 5' (1.524 m)    General: Well developed, well nourished, in no acute distress  Skin : Warm and dry.  Head: Normocephalic and atraumatic  Lungs: Respirations unlabored; clear to auscultation bilaterally without wheeze, rales, rhonchi  CVS exam: normal rate and regular rhythm.  Musculoskeletal: No deformities; no active joint inflammation  Extremities: No edema, cyanosis, clubbing  Vessels: Symmetric bilaterally  Neurologic: Alert and oriented; speech intact; face symmetrical; moves all extremities well; CNII-XII intact without focal  deficit   Assessment:  1. Essential hypertension   2. Encounter for screening colonoscopy   3. Sarcoidosis     Plan:  1. Good improvement; continue Amlodipine 10 mg daily; follow-up in 4 months; 2. Referral updated; 3. Reviewed recent labs- keep planned rheumatology evaluation; may need pulmonology consult as well; follow-up to be determined;  Discussed updating vaccines and patient notes she does not like vaccines;  This visit occurred during the SARS-CoV-2 public health emergency.  Safety protocols were in place, including screening questions prior to the visit, additional usage of staff PPE, and extensive cleaning of exam room while observing appropriate contact time as indicated for disinfecting solutions.     Return in about 4 months (around 02/20/2020).  Orders Placed This Encounter  Procedures  . Ambulatory referral to Gastroenterology    Referral Priority:   Routine    Referral Type:   Consultation    Referral Reason:   Specialty Services Required    Number of Visits Requested:   1    Requested Prescriptions    No prescriptions requested or ordered in this encounter

## 2019-12-12 ENCOUNTER — Other Ambulatory Visit: Payer: Self-pay | Admitting: Family

## 2019-12-18 ENCOUNTER — Other Ambulatory Visit: Payer: Self-pay | Admitting: Family

## 2019-12-23 ENCOUNTER — Encounter: Payer: BC Managed Care – PPO | Admitting: Gastroenterology

## 2020-01-20 ENCOUNTER — Ambulatory Visit: Payer: BC Managed Care – PPO | Admitting: Rheumatology

## 2020-02-17 ENCOUNTER — Ambulatory Visit: Payer: BC Managed Care – PPO | Admitting: Rheumatology

## 2020-02-20 ENCOUNTER — Other Ambulatory Visit: Payer: Self-pay

## 2020-02-20 ENCOUNTER — Encounter: Payer: Self-pay | Admitting: Family

## 2020-02-20 ENCOUNTER — Ambulatory Visit (INDEPENDENT_AMBULATORY_CARE_PROVIDER_SITE_OTHER): Payer: BC Managed Care – PPO | Admitting: Family

## 2020-02-20 VITALS — BP 150/98 | HR 90 | Temp 98.4°F | Ht 60.0 in | Wt 219.8 lb

## 2020-02-20 DIAGNOSIS — D869 Sarcoidosis, unspecified: Secondary | ICD-10-CM

## 2020-02-20 DIAGNOSIS — Z1211 Encounter for screening for malignant neoplasm of colon: Secondary | ICD-10-CM

## 2020-02-20 DIAGNOSIS — I10 Essential (primary) hypertension: Secondary | ICD-10-CM

## 2020-02-20 MED ORDER — AMLODIPINE BESYLATE 10 MG PO TABS: 10.0000 mg | ORAL_TABLET | Freq: Every day | ORAL | 3 refills | Status: DC

## 2020-02-20 NOTE — Progress Notes (Signed)
  Susan Morton is a 49 y.o. female with the following history as recorded in EpicCare:  Patient Active Problem List   Diagnosis Date Noted  . Iron deficiency anemia 11/13/2017    Current Outpatient Medications  Medication Sig Dispense Refill  . amLODipine (NORVASC) 10 MG tablet Take 1 tablet (10 mg total) by mouth daily. 90 tablet 3   No current facility-administered medications for this visit.    Allergies: Patient has no known allergies.  History reviewed. No pertinent past medical history.  History reviewed. No pertinent surgical history.  Family History  Problem Relation Age of Onset  . Heart disease Mother   . Hypertension Father     Social History   Tobacco Use  . Smoking status: Never Smoker  . Smokeless tobacco: Never Used  Substance Use Topics  . Alcohol use: Yes    Comment: 1 glass of wine per week    Subjective:  4 month follow-up on hypertension; patient notes she was feeling better so she opted to stop her blood pressure medication; has not been checking regularly; Denies any chest pain, shortness of breath, blurred vision or headache Planning to see her GYN next week for CPE/ mammogram/ pap smear;   Objective:  Vitals:   02/20/20 1513  BP: (!) 150/98  Pulse: 90  Temp: 98.4 F (36.9 C)  TempSrc: Oral  SpO2: 98%  Weight: 219 lb 12.8 oz (99.7 kg)  Height: 5' (1.524 m)    General: Well developed, well nourished, in no acute distress  Head: Normocephalic and atraumatic  Eyes: Sclera and conjunctiva clear; pupils round and reactive to light; extraocular movements intact  Ears: External normal; canals clear; tympanic membranes normal  Oropharynx: Pink, supple. No suspicious lesions  Neck: Supple without thyromegaly, adenopathy  Lungs: Respirations unlabored; clear to auscultation bilaterally without wheeze, rales, rhonchi  CVS exam: normal rate and regular rhythm.  Neurologic: Alert and oriented; speech intact; face symmetrical; moves all extremities well;  CNII-XII intact without focal deficit   Assessment:  1. Essential hypertension   2. Sarcoidosis   3. Colon cancer screening     Plan:  1. Re-start blood pressure medication; stressed need to take it daily;  2. Stressed importance of keeping upcoming appointment with rheumatology; may also need to see pulmonology; 3. Patient agrees to Cologuard as alternative to colonoscopy;   She defers flu shot/ is considering COVID vaccine and understands she can get at her pharmacy;  Plan for follow-up here in 4-6 months;  This visit occurred during the SARS-CoV-2 public health emergency.  Safety protocols were in place, including screening questions prior to the visit, additional usage of staff PPE, and extensive cleaning of exam room while observing appropriate contact time as indicated for disinfecting solutions.     No follow-ups on file.  No orders of the defined types were placed in this encounter.   Requested Prescriptions   Signed Prescriptions Disp Refills  . amLODipine (NORVASC) 10 MG tablet 90 tablet 3    Sig: Take 1 tablet (10 mg total) by mouth daily.

## 2020-04-09 LAB — COLOGUARD: COLOGUARD: NEGATIVE

## 2020-04-11 NOTE — Progress Notes (Signed)
Office Visit Note  Patient: Susan Morton             Date of Birth: 1970/07/23           MRN: [id number]             PCP: Marrian Salvage, FNP Referring: Marrian Salvage,* Visit Date: 04/25/2020 Occupation: _0 @  Subjective:  +ANA.   History of Present Illness: Susan Morton is a 49 y.o. female seen in consultation per request of her PCP for evaluation of positive ANA.  According to the patient in 2003 she developed an episode of iritis in her right eye and right knee joint pain.  At that time her ophthalmologist did work-up including a chest x-ray which showed hilar lymphadenopathy.  She also had a CT scan of the chest which showed mediastinal and hilar lymphadenopathy with suspicion of sarcoidosis.  She states she did not see a pulmonologist and never developed any symptoms.  She states even iritis was not treated and it resolved by itself.  She has had no recurrence of symptoms since 2003.  She denies any joint pain or joint swelling now.  She states that she went for a physical and her PCP did lab work which showed positive ANA for that reason she was referred to me.  There is no history of oral ulcers, nasal ulcers, sicca symptoms, malar rash, Raynaud's phenomenon, rash, photosensitivity or lymphadenopathy.  She denies any history of  joint swelling.  He complains of intermittent pain in her right first MTP joint.  She states the pain is not severe it usually occurs when she is walking.  There is no family history of autoimmune disease.  She is gravida 2, para 2.  Activities of Daily Living:  Patient reports morning stiffness for 0 minutes.   Patient Denies nocturnal pain.  Difficulty dressing/grooming: Denies Difficulty climbing stairs: Reports Difficulty getting out of chair: Denies Difficulty using hands for taps, buttons, cutlery, and/or writing: Denies  Review of Systems  Constitutional: Negative for fatigue.  HENT: Negative for mouth sores, mouth dryness and  nose dryness.   Eyes: Negative for pain, itching and dryness.  Respiratory: Negative for shortness of breath and difficulty breathing.   Cardiovascular: Negative for chest pain and palpitations.  Gastrointestinal: Negative for blood in stool, constipation and diarrhea.  Endocrine: Negative for increased urination.  Genitourinary: Negative for difficulty urinating.  Musculoskeletal: Negative for arthralgias, joint pain, joint swelling, myalgias, morning stiffness, muscle tenderness and myalgias.  Skin: Negative for color change, rash and redness.  Allergic/Immunologic: Negative for susceptible to infections.  Neurological: Negative for dizziness, numbness, headaches, memory loss and weakness.  Hematological: Negative for bruising/bleeding tendency.  Psychiatric/Behavioral: Negative for confusion.    PMFS History:  Patient Active Problem List   Diagnosis Date Noted  . Essential hypertension 04/25/2020  . Iron deficiency anemia 11/13/2017    History reviewed. No pertinent past medical history.  Family History  Problem Relation Age of Onset  . Heart disease Mother   . Hypertension Father   . Healthy Daughter   . Healthy Son   . Hypertension Sister   . Heart disease Sister   . Hypertension Brother   . Hypertension Sister    History reviewed. No pertinent surgical history. 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     There is no immunization history on file for this patient.   Objective: Vital Signs: BP (!) 156/102 (BP Location: Right Arm, Patient Position: Sitting, Cuff Size: Normal)   Pulse 89   Resp 15   Ht 5' (1.524 m)   Wt 221 lb 6.4 oz (100.4 kg)   BMI 43.24 kg/m    Physical Exam Vitals and nursing note reviewed.  Constitutional:      Appearance:  She is well-developed and well-nourished.  HENT:     Head: Normocephalic and atraumatic.  Eyes:     Extraocular Movements: EOM normal.     Conjunctiva/sclera: Conjunctivae normal.  Cardiovascular:     Rate and Rhythm: Normal rate and regular rhythm.     Pulses: Intact distal pulses.     Heart sounds: Normal heart sounds.  Pulmonary:     Effort: Pulmonary effort is normal.     Breath sounds: Normal breath sounds.  Abdominal:     General: Bowel sounds are normal.     Palpations: Abdomen is soft.  Musculoskeletal:     Cervical back: Normal range of motion.  Lymphadenopathy:     Cervical: No cervical adenopathy.  Skin:    General: Skin is warm and dry.     Capillary Refill: Capillary refill takes less than 2 seconds.  Neurological:     Mental Status: She is alert and oriented to person, place, and time.  Psychiatric:        Mood and Affect: Mood and affect normal.        Behavior: Behavior normal.      Musculoskeletal Exam: C-spine thoracic and lumbar spine with good range of motion.  Shoulder joints, elbow joints, wrist joints, MCPs PIPs and DIPs with good range of motion with no synovitis.  Hip joints, knee joints, ankles, MTPs and PIPs with good range of motion with no synovitis.  CDAI Exam: CDAI Score: -- Patient Global: --; Provider Global: -- Swollen: --; Tender: -- Joint Exam 04/25/2020   No joint exam has been documented for this visit   There is currently no information documented on the homunculus. Go to the Rheumatology activity and complete the homunculus joint exam.  Investigation: No additional findings.  Imaging: XR Foot 2 Views Right  Result Date: 04/25/2020 First MTP narrowing and subluxation was noted. PIP and DIP narrowing was noted. None of the other MTP showed narrowing. No intertarsal joint space narrowing was noted. No subtalar tibiotalar joint space narrowing was noted. Inferior calcaneal spur was noted. Impression: These findings are consistent  with osteoarthritis of the foot.   Recent Labs: Lab Results  Component Value Date   WBC 4.2 09/19/2019   HGB 14.0 09/19/2019   PLT 287.0 09/19/2019   NA 136 09/19/2019   K 3.9 09/19/2019   CL 103 09/19/2019   CO2 27 09/19/2019   GLUCOSE 97 09/19/2019   BUN 20 09/19/2019   CREATININE 0.72 09/19/2019   BILITOT 0.4 09/19/2019   ALKPHOS 71 09/19/2019   AST 18 09/19/2019   ALT 11 09/19/2019   PROT 8.4 (H) 09/19/2019   ALBUMIN 4.3 09/19/2019   CALCIUM 9.3 09/19/2019    Speciality Comments: No specialty comments available.  Procedures:  No procedures performed Allergies: Patient has no known allergies.   Assessment / Plan:     Visit Diagnoses: Positive ANA (antinuclear antibody) - 09/19/19: ANA 1:1280 nuclear, discrete nuclear dots, 1:80NS, ESR 32, RF<14, Ace 45,  vitamin D 24.6, TSH 1.37 -patient was incidentally found to have positive ANA.  She is concerned about lupus or other autoimmune disease.  She has no history of oral ulcers, nasal ulcers, malar rash, photosensitivity, Raynaud's phenomenon, sicca symptoms or lymphadenopathy.  She had no synovitis on examination.  To complete the work-up I'll obtain following labs.  Plan: Urinalysis, Routine w reflex microscopic, Anti-scleroderma antibody, RNP Antibody, Anti-Smith antibody, Sjogrens syndrome-A extractable nuclear antibody, Sjogrens syndrome-B extractable nuclear antibody, Anti-DNA antibody, double-stranded, C3 and C4, Beta-2 glycoprotein antibodies, Cardiolipin antibodies, IgG, IgM, IgA, Lupus Anticoagulant Eval w/Reflex  History of iritis - 2003.  Patient denies any recurrence of iritis after that initial episode.  She states it was not treated with eyedrops or any medications.  She has not seen an ophthalmologist in many years.  Have advised her to schedule an appointment for a regular visit.  Hilar lymphadenopathy - Noted on chest CT scan 2003.  Suspicion of sarcoidosis.  No further work-up was done.  Patient stayed   asymptomatic. - Plan: DG Chest 2 View  Right foot pain-she gives history of intermittent discomfort in her right first MTP joint.  She just denies any joint swelling.  She states the pain is not severe.  I'll obtain x-ray of her right foot 2 views today. X-rays were consistent with osteoarthritis of the foot. X-ray findings were discussed with the patient. Upper fitting shoes with arch support was discussed. I will also obtain uric acid level.  Essential hypertension-her blood pressure is elevated.  She states it is improving.  She is on amlodipine.  She states she has been under a lot of stress at work.  Weight loss diet and exercise was discussed.  History of iron deficiency anemia  BMI 43.24-weight loss diet and exercise was discussed.  Information on weight management clinic was given.  Orders: Orders Placed This Encounter  Procedures  . DG Chest 2 View  . XR Foot 2 Views Right  . Urinalysis, Routine w reflex microscopic  . Anti-scleroderma antibody  . RNP Antibody  . Anti-Smith antibody  . Sjogrens syndrome-A extractable nuclear antibody  . Sjogrens syndrome-B extractable nuclear antibody  . Anti-DNA antibody, double-stranded  . C3 and C4  . Beta-2 glycoprotein antibodies  . Cardiolipin antibodies, IgG, IgM, IgA  . Lupus Anticoagulant Eval w/Reflex  . Uric acid   No orders of the defined types were placed in this encounter.     Follow-Up Instructions: Return for Positive ANA.   Bo Merino, MD  Note - This record has been created using Editor, commissioning.  Chart creation errors have been sought, but may not always  have been located. Such creation errors do not reflect on  the standard of medical care.

## 2020-04-16 ENCOUNTER — Ambulatory Visit (INDEPENDENT_AMBULATORY_CARE_PROVIDER_SITE_OTHER): Payer: BC Managed Care – PPO | Admitting: Family

## 2020-04-16 ENCOUNTER — Other Ambulatory Visit: Payer: Self-pay

## 2020-04-16 VITALS — BP 192/110 | HR 105 | Temp 98.1°F | Ht 60.0 in | Wt 211.0 lb

## 2020-04-16 DIAGNOSIS — Z1211 Encounter for screening for malignant neoplasm of colon: Secondary | ICD-10-CM

## 2020-04-16 DIAGNOSIS — D219 Benign neoplasm of connective and other soft tissue, unspecified: Secondary | ICD-10-CM | POA: Diagnosis not present

## 2020-04-16 DIAGNOSIS — I10 Essential (primary) hypertension: Secondary | ICD-10-CM

## 2020-04-16 DIAGNOSIS — D869 Sarcoidosis, unspecified: Secondary | ICD-10-CM | POA: Diagnosis not present

## 2020-04-16 MED ORDER — AMLODIPINE BESYLATE 10 MG PO TABS: 10.0000 mg | ORAL_TABLET | Freq: Every day | ORAL | 1 refills | Status: DC

## 2020-04-16 NOTE — Progress Notes (Signed)
  Susan Morton is a 49 y.o. female with the following history as recorded in Epic[facility]:  Patient Active Problem List   Diagnosis Date Noted  . Iron deficiency anemia 11/13/2017    Current Outpatient Medications  Medication Sig Dispense Refill  . amLODipine (NORVASC) 10 MG tablet Take 1 tablet (10 mg total) by mouth daily. 90 tablet 1   No current facility-administered medications for this visit.    Allergies: Patient has no known allergies.  No past medical history on file.  No past surgical history on file.  Family History  Problem Relation Age of Onset  . Heart disease Mother   . Hypertension Father     Social History   Tobacco Use  . Smoking status: Never Smoker  . Smokeless tobacco: Never Used  Substance Use Topics  . Alcohol use: Yes    Comment: 1 glass of wine per week    Subjective:  Patient originally scheduled the visit today to discuss her concerns about recommended "fibroid treatment" being recommended by her GYN- has been having some bleeding; At time of OV, she was unexpectedly called in for a conference call about her father's declining health- she readily admits she is not taking her blood pressure medication but feels it is elevated today due to stressors regarding her father's health changes;  Also requesting results from her Cologuard test that was done a few weeks ago;    Objective:  Vitals:   04/16/20 1536  BP: (!) 192/110  Pulse: (!) 105  Temp: 98.1 F (36.7 C)  TempSrc: Oral  SpO2: 99%  Weight: 211 lb (95.7 kg)  Height: 5' (1.524 m)    General: Well developed, well nourished, in no acute distress  Skin : Warm and dry.  Head: Normocephalic and atraumatic  Eyes: Sclera and conjunctiva clear; pupils round and reactive to light; extraocular movements intact  Lungs: Respirations unlabored; clear to auscultation bilaterally without wheeze, rales, rhonchi  CVS exam: normal rate and regular rhythm.  Neurologic: Alert and oriented; speech intact; face  symmetrical; moves all extremities well; CNII-XII intact without focal deficit   Assessment:  1. Essential hypertension   2. Fibroids   3. Sarcoidosis   4. Colon cancer screening     Plan:  1. Stressed absolute need to get on her blood pressure medication and take daily as prescribed; she agrees and notes she will follow up in 1 month to re-check; 2. She will follow up with her GYN as scheduled; she understands they will not be able to do any type of procedure with her blood pressure being so high; 3. Reminded patient about upcoming appointment with rheumatology- she is reminded to cancel and re-schedule if she cannot keep upcoming appointment; 4. Results given and reviewed for negative Cologuard; plan to re-check in 3 years;     This visit occurred during the SARS-CoV-2 public health emergency.  Safety protocols were in place, including screening questions prior to the visit, additional usage of staff PPE, and extensive cleaning of exam room while observing appropriate contact time as indicated for disinfecting solutions.     No follow-ups on file.  No orders of the defined types were placed in this encounter.   Requested Prescriptions   Signed Prescriptions Disp Refills  . amLODipine (NORVASC) 10 MG tablet 90 tablet 1    Sig: Take 1 tablet (10 mg total) by mouth daily.

## 2020-04-25 ENCOUNTER — Other Ambulatory Visit: Payer: Self-pay

## 2020-04-25 ENCOUNTER — Ambulatory Visit (HOSPITAL_COMMUNITY)
Admission: RE | Admit: 2020-04-25 | Discharge: 2020-04-25 | Disposition: A | Discharge status: Home / Self Care | Payer: BC Managed Care – PPO | Source: Ambulatory Visit | Attending: Rheumatology | Admitting: Rheumatology

## 2020-04-25 ENCOUNTER — Ambulatory Visit (INDEPENDENT_AMBULATORY_CARE_PROVIDER_SITE_OTHER): Payer: BC Managed Care – PPO | Admitting: Rheumatology

## 2020-04-25 ENCOUNTER — Ambulatory Visit: Payer: Self-pay

## 2020-04-25 ENCOUNTER — Encounter: Payer: Self-pay | Admitting: Rheumatology

## 2020-04-25 VITALS — BP 156/102 | HR 89 | Resp 15 | Ht 60.0 in | Wt 221.4 lb

## 2020-04-25 DIAGNOSIS — Z8669 Personal history of other diseases of the nervous system and sense organs: Secondary | ICD-10-CM | POA: Diagnosis not present

## 2020-04-25 DIAGNOSIS — Z6841 Body Mass Index (BMI) 40.0 and over, adult: Secondary | ICD-10-CM

## 2020-04-25 DIAGNOSIS — R768 Other specified abnormal immunological findings in serum: Secondary | ICD-10-CM | POA: Diagnosis not present

## 2020-04-25 DIAGNOSIS — R590 Localized enlarged lymph nodes: Secondary | ICD-10-CM

## 2020-04-25 DIAGNOSIS — M79671 Pain in right foot: Secondary | ICD-10-CM

## 2020-04-25 DIAGNOSIS — Z862 Personal history of diseases of the blood and blood-forming organs and certain disorders involving the immune mechanism: Secondary | ICD-10-CM

## 2020-04-25 DIAGNOSIS — D869 Sarcoidosis, unspecified: Secondary | ICD-10-CM

## 2020-04-25 DIAGNOSIS — I10 Essential (primary) hypertension: Secondary | ICD-10-CM

## 2020-04-25 NOTE — Progress Notes (Signed)
I notified patient of her chest x-ray results.

## 2020-04-29 LAB — URINALYSIS, ROUTINE W REFLEX MICROSCOPIC
Bacteria, UA: NONE SEEN /HPF
Bilirubin Urine: NEGATIVE
Glucose, UA: NEGATIVE
Hyaline Cast: NONE SEEN /LPF
Ketones, ur: NEGATIVE
Leukocytes,Ua: NEGATIVE
Nitrite: NEGATIVE
Protein, ur: NEGATIVE
Specific Gravity, Urine: 1.018 (ref 1.001–1.03)
WBC, UA: NONE SEEN /HPF (ref 0–5)
pH: 6 (ref 5.0–8.0)

## 2020-04-29 LAB — BETA-2 GLYCOPROTEIN ANTIBODIES
Beta-2 Glyco 1 IgA: <2 U/mL
Beta-2 Glyco 1 IgM: <2 U/mL
Beta-2 Glyco I IgG: <2 U/mL

## 2020-04-29 LAB — LUPUS ANTICOAGULANT EVAL W/ REFLEX
PTT-LA Screen: 25 s (ref <=40)
dRVVT: 33 s (ref <=45)

## 2020-04-29 LAB — C3 AND C4
C3 Complement: 186 mg/dL (ref 83–193)
C4 Complement: 46 mg/dL (ref 15–57)

## 2020-04-29 LAB — CARDIOLIPIN ANTIBODIES, IGG, IGM, IGA
Anticardiolipin IgA: <2 APL-U/mL
Anticardiolipin IgG: <2 GPL-U/mL
Anticardiolipin IgM: <2 MPL-U/mL

## 2020-04-29 LAB — ANTI-SCLERODERMA ANTIBODY: Scleroderma (Scl-70) (ENA) Antibody, IgG: <1 AI

## 2020-04-29 LAB — ANTI-SMITH ANTIBODY: ENA SM Ab Ser-aCnc: <1 AI

## 2020-04-29 LAB — RNP ANTIBODY: Ribonucleic Protein(ENA) Antibody, IgG: <1 AI

## 2020-04-29 LAB — SJOGRENS SYNDROME-A EXTRACTABLE NUCLEAR ANTIBODY: SSA (Ro) (ENA) Antibody, IgG: <1 AI

## 2020-04-29 LAB — URIC ACID: Uric Acid, Serum: 7 mg/dL (ref 2.5–7.0)

## 2020-04-29 LAB — ANTI-DNA ANTIBODY, DOUBLE-STRANDED: ds DNA Ab: 1 IU/mL

## 2020-04-29 LAB — SJOGRENS SYNDROME-B EXTRACTABLE NUCLEAR ANTIBODY: SSB (La) (ENA) Antibody, IgG: <1 AI

## 2020-04-30 NOTE — Progress Notes (Signed)
I will discuss results at the follow-up visit.

## 2020-05-06 NOTE — Progress Notes (Signed)
Office Visit Note  Patient: Susan Morton             Date of Birth: 08-Jun-1970           MRN: [id number]             PCP: Marrian Salvage, [place] Referring: Marrian Salvage,* Visit Date: 05/17/2020 Occupation: @GUAROCC @  Subjective:  Pain in feet.   History of Present Illness: Susan Morton is a 50 y.o. female with history of positive ANA and joint pain.  She states she has been trying to exercise and lose weight.  She lost 2 pounds since the last visit.  She continues to have some discomfort in her toe.  She denies any joint swelling.  There is no history of oral ulcers, nasal ulcers, malar rash, photosensitivity, sicca symptoms, Raynaud's or lymphadenopathy.  Activities of Daily Living:  Patient reports morning stiffness for 0 minutes.   Patient Denies nocturnal pain.  Difficulty dressing/grooming: Denies Difficulty climbing stairs: Reports Difficulty getting out of chair: Denies Difficulty using hands for taps, buttons, cutlery, and/or writing: Denies  Review of Systems  Constitutional: Positive for night sweats. Negative for fatigue.  HENT: Negative for mouth sores, mouth dryness and nose dryness.   Eyes: Negative for pain, itching and dryness.  Respiratory: Negative for shortness of breath and difficulty breathing.   Cardiovascular: Negative for chest pain and palpitations.  Gastrointestinal: Negative for blood in stool, constipation and diarrhea.  Endocrine: Negative for increased urination.  Genitourinary: Negative for difficulty urinating.  Musculoskeletal: Negative for arthralgias, joint pain, joint swelling, myalgias, morning stiffness, muscle tenderness and myalgias.  Skin: Negative for color change, rash and redness.  Allergic/Immunologic: Negative for susceptible to infections.  Neurological: Positive for night sweats. Negative for dizziness, numbness, headaches, memory loss and weakness.  Hematological: Negative for bruising/bleeding tendency.   Psychiatric/Behavioral: Negative for confusion.    PMFS History:  Patient Active Problem List   Diagnosis Date Noted  . Primary osteoarthritis of both feet 05/17/2020  . Hilar lymphadenopathy 05/17/2020  . Essential hypertension 04/25/2020  . Iron deficiency anemia 11/13/2017    History reviewed. No pertinent past medical history.  Family History  Problem Relation Age of Onset  . Heart disease Mother   . Hypertension Father   . Healthy Daughter   . Healthy Son   . Hypertension Sister   . Heart disease Sister   . Hypertension Brother   . Hypertension Sister    History reviewed. No pertinent surgical history. 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    Immunization History  Administered Date(s) Administered  . Moderna Sars-Covid-2 Vaccination 05/04/2020     Objective: Vital Signs: BP (!) 154/95 (BP Location: Left Arm, Patient Position: Sitting, Cuff Size: Normal)   Pulse 89   Resp 14   Ht 5\' 1"  (1.549 m)   Wt 219 lb (99.3 kg)   BMI 41.38 kg/m    Physical Exam Vitals and nursing note reviewed.  Constitutional:      Appearance: She is well-developed and well-nourished.  HENT:     Head: Normocephalic and atraumatic.  Eyes:     Extraocular Movements: EOM normal.     Conjunctiva/sclera: Conjunctivae normal.  Cardiovascular:     Rate and  Rhythm: Normal rate and regular rhythm.     Pulses: Intact distal pulses.     Heart sounds: Normal heart sounds.  Pulmonary:     Effort: Pulmonary effort is normal.     Breath sounds: Normal breath sounds.  Abdominal:     General: Bowel sounds are normal.     Palpations: Abdomen is soft.  Musculoskeletal:     Cervical back: Normal range of motion.  Lymphadenopathy:     Cervical: No cervical adenopathy.   Skin:    General: Skin is warm and dry.     Capillary Refill: Capillary refill takes less than 2 seconds.  Neurological:     Mental Status: She is alert and oriented to person, place, and time.  Psychiatric:        Mood and Affect: Mood and affect normal.        Behavior: Behavior normal.      Musculoskeletal Exam: C-spine thoracic and lumbar spine were in good range of motion.  Shoulder joints, elbow joints, wrist joints, MCPs PIPs and DIPs with good range of motion with no synovitis.  Hip joints, knee joints, ankles with good range of motion.  She had no tenderness over MTPs.  CDAI Exam: CDAI Score: -- Patient Global: --; Provider Global: -- Swollen: --; Tender: -- Joint Exam 05/17/2020   No joint exam has been documented for this visit   There is currently no information documented on the homunculus. Go to the Rheumatology activity and complete the homunculus joint exam.  Investigation: No additional findings.  Imaging: DG Chest 2 View  Result Date: 04/25/2020 CLINICAL DATA:  History of mediastinal lymphadenopathy EXAM: CHEST - 2 VIEW COMPARISON:  None. FINDINGS: No focal consolidation. No pleural effusion or pneumothorax. Prominence of the right hilum which may reflect prominent pulmonary vasculature versus lymphadenopathy. Heart size is normal. No acute osseous abnormality. IMPRESSION: Prominence of the right hilum which may reflect prominent pulmonary vasculature versus lymphadenopathy. No acute cardiopulmonary disease. Electronically Signed   By: Kathreen Devoid   On: 04/25/2020 11:34   XR Foot 2 Views Right  Result Date: 04/25/2020 First MTP narrowing and subluxation was noted. PIP and DIP narrowing was noted. None of the other MTP showed narrowing. No intertarsal joint space narrowing was noted. No subtalar tibiotalar joint space narrowing was noted. Inferior calcaneal spur was noted. Impression: These findings are consistent with osteoarthritis of the foot.   Recent  Labs: Lab Results  Component Value Date   WBC 4.2 09/19/2019   HGB 14.0 09/19/2019   PLT 287.0 09/19/2019   NA 136 09/19/2019   K 3.9 09/19/2019   CL 103 09/19/2019   CO2 27 09/19/2019   GLUCOSE 97 09/19/2019   BUN 20 09/19/2019   CREATININE 0.72 09/19/2019   BILITOT 0.4 09/19/2019   ALKPHOS 71 09/19/2019   AST 18 09/19/2019   ALT 11 09/19/2019   PROT 8.4 (H) 09/19/2019   ALBUMIN 4.3 09/19/2019   CALCIUM 9.3 09/19/2019   April 25, 2020 UA negative except hemoglobin dipstick trace positive, uric acid 7.0, ENA negative, C3-C4 normal, anticardiolipin negative, beta-2 GP 1 -, lupus anticoagulant negative  Speciality Comments: No specialty comments available.  Procedures:  No procedures performed Allergies: Patient has no known allergies.   Assessment / Plan:     Visit Diagnoses: Positive ANA (antinuclear antibody) - Sep 19, 2019 ANA 1: 1280 nucleolar dots, 1: 80 nuclear speckled, ENA negative, complements normal.  No clinical features of lupus.  Labs were discussed at length.  Her UA showed trace blood and protein.  Have advised her to have repeat UA with her PCP.  History of iritis - In 2003 with no recurrence.  Hilar lymphadenopathy - Noted on the CT chest in 2003 per patient.  Recent chest x-ray showed some hilar fullness.  I discussed the findings with the patient.  She would prefer a pulmonary referral.  Primary osteoarthritis of both feet - Clinical and radiographic findings are consistent with osteoarthritis.  Uric acid was 7.1.  She is noticing some relief after weight loss.  She states she has to stand for prolonged time at work which causes discomfort.  Proper fitting shoes were discussed.  Essential hypertension-her blood pressure is elevated today.  I have advised her to monitor blood pressure closely and follow-up with her PCP.  History of iron deficiency anemia  BMI 40.0-44.9, adult (HCC) - Information on the weight management clinic was given at the last visit.   Patient wants to try weight loss herself.  Orders: Orders Placed This Encounter  Procedures  . Ambulatory referral to Pulmonology   No orders of the defined types were placed in this encounter.    Follow-Up Instructions: Return in about 1 year (around 05/17/2021), or if symptoms worsen or fail to improve, for Osteoarthritis.   Pollyann Savoy, MD  Note - This record has been created using Animal nutritionist.  Chart creation errors have been sought, but may not always  have been located. Such creation errors do not reflect on  the standard of medical care.

## 2020-05-17 ENCOUNTER — Ambulatory Visit (INDEPENDENT_AMBULATORY_CARE_PROVIDER_SITE_OTHER): Payer: BC Managed Care – PPO | Admitting: Rheumatology

## 2020-05-17 ENCOUNTER — Encounter: Payer: Self-pay | Admitting: Rheumatology

## 2020-05-17 ENCOUNTER — Other Ambulatory Visit: Payer: Self-pay

## 2020-05-17 VITALS — BP 154/95 | HR 89 | Resp 14 | Ht 61.0 in | Wt 219.0 lb

## 2020-05-17 DIAGNOSIS — Z862 Personal history of diseases of the blood and blood-forming organs and certain disorders involving the immune mechanism: Secondary | ICD-10-CM

## 2020-05-17 DIAGNOSIS — R590 Localized enlarged lymph nodes: Secondary | ICD-10-CM

## 2020-05-17 DIAGNOSIS — Z8669 Personal history of other diseases of the nervous system and sense organs: Secondary | ICD-10-CM

## 2020-05-17 DIAGNOSIS — R768 Other specified abnormal immunological findings in serum: Secondary | ICD-10-CM

## 2020-05-17 DIAGNOSIS — M19072 Primary osteoarthritis, left ankle and foot: Secondary | ICD-10-CM

## 2020-05-17 DIAGNOSIS — Z6841 Body Mass Index (BMI) 40.0 and over, adult: Secondary | ICD-10-CM

## 2020-05-17 DIAGNOSIS — M19071 Primary osteoarthritis, right ankle and foot: Secondary | ICD-10-CM | POA: Diagnosis not present

## 2020-05-17 DIAGNOSIS — I10 Essential (primary) hypertension: Secondary | ICD-10-CM

## 2020-07-02 ENCOUNTER — Institutional Professional Consult (permissible substitution): Payer: BC Managed Care – PPO | Admitting: Emergency Medicine

## 2021-05-02 NOTE — Progress Notes (Deleted)
Office Visit Note  Patient: Susan Morton             Date of Birth: 1970/07/10           MRN: [id number]             PCP: Marrian Salvage, FNP Referring: Marrian Salvage,* Visit Date: 05/16/2021 Occupation: @GUAROCC @  Subjective:  No chief complaint on file.   History of Present Illness: Susan Morton is a 50 y.o. female ***   Activities of Daily Living:  Patient reports morning stiffness for *** {minute/hour:19697}.   Patient {ACTIONS;DENIES/REPORTS:21021675::"Denies"} nocturnal pain.  Difficulty dressing/grooming: {ACTIONS;DENIES/REPORTS:21021675::"Denies"} Difficulty climbing stairs: {ACTIONS;DENIES/REPORTS:21021675::"Denies"} Difficulty getting out of chair: {ACTIONS;DENIES/REPORTS:21021675::"Denies"} Difficulty using hands for taps, buttons, cutlery, and/or writing: {ACTIONS;DENIES/REPORTS:21021675::"Denies"}  No Rheumatology ROS completed.   PMFS History:  Patient Active Problem List   Diagnosis Date Noted   Primary osteoarthritis of both feet 05/17/2020   Hilar lymphadenopathy 05/17/2020   Essential hypertension 04/25/2020   Iron deficiency anemia 11/13/2017    No past medical history on file.  Family History  Problem Relation Age of Onset   Heart disease Mother    Hypertension Father    Healthy Daughter    Healthy Son    Hypertension Sister    Heart disease Sister    Hypertension Brother    Hypertension Sister    No past surgical history on file. 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    Immunization History  Administered Date(s) Administered   [employer] Vaccination 05/04/2020     Objective: Vital Signs: There were no vitals taken for this visit.   Physical Exam   Musculoskeletal Exam:  ***  CDAI Exam: CDAI Score: -- Patient Global: --; Provider Global: -- Swollen: --; Tender: -- Joint Exam 05/16/2021   No joint exam has been documented for this visit   There is currently no information documented on the homunculus. Go to the Rheumatology activity and complete the homunculus joint exam.  Investigation: No additional findings.  Imaging: No results found.  Recent Labs: Lab Results  Component Value Date   WBC 4.2 09/19/2019   HGB 14.0 09/19/2019   PLT 287.0 09/19/2019   NA 136 09/19/2019   K 3.9 09/19/2019   CL 103 09/19/2019   CO2 27 09/19/2019   GLUCOSE 97 09/19/2019   BUN 20 09/19/2019   CREATININE 0.72 09/19/2019   BILITOT 0.4 09/19/2019   ALKPHOS 71 09/19/2019   AST 18 09/19/2019   ALT 11 09/19/2019   PROT 8.4 (H) 09/19/2019   ALBUMIN 4.3 09/19/2019   CALCIUM 9.3 09/19/2019    Speciality Comments: No specialty comments available.  Procedures:  No procedures performed Allergies: Patient has no known allergies.   Assessment / Plan:     Visit Diagnoses: No diagnosis found.  Orders: No orders of the defined types were placed in this encounter.  No orders of the defined types were placed in this encounter.   Face-to-face time spent with patient was *** minutes. Greater than 50% of time was spent in counseling and coordination of care.  Follow-Up Instructions: No follow-ups on file.   Earnestine Mealing, CMA  Note - This record has been created using Editor, commissioning.  Chart creation errors have  been sought, but may not always  have been located. Such creation errors do not reflect on  the standard of medical care.

## 2021-05-16 ENCOUNTER — Ambulatory Visit: Payer: BC Managed Care – PPO | Admitting: Rheumatology

## 2021-07-12 ENCOUNTER — Ambulatory Visit (INDEPENDENT_AMBULATORY_CARE_PROVIDER_SITE_OTHER): Payer: BC Managed Care – PPO | Admitting: Internal Medicine

## 2021-07-12 ENCOUNTER — Encounter: Payer: Self-pay | Admitting: Internal Medicine

## 2021-07-12 ENCOUNTER — Other Ambulatory Visit: Payer: Self-pay

## 2021-07-12 VITALS — BP 158/116 | HR 88 | Temp 97.8°F | Ht 61.0 in | Wt 208.0 lb

## 2021-07-12 DIAGNOSIS — D509 Iron deficiency anemia, unspecified: Secondary | ICD-10-CM | POA: Diagnosis not present

## 2021-07-12 DIAGNOSIS — I10 Essential (primary) hypertension: Secondary | ICD-10-CM | POA: Diagnosis not present

## 2021-07-12 LAB — COMPREHENSIVE METABOLIC PANEL
ALT: 23 U/L (ref 0–35)
AST: 23 U/L (ref 0–37)
Albumin: 4.5 g/dL (ref 3.5–5.2)
Alkaline Phosphatase: 63 U/L (ref 39–117)
BUN: 18 mg/dL (ref 6–23)
CO2: 29 mEq/L (ref 19–32)
Calcium: 9.9 mg/dL (ref 8.4–10.5)
Chloride: 103 mEq/L (ref 96–112)
Creatinine, Ser: 0.74 mg/dL (ref 0.40–1.20)
GFR: 92.66 mL/min (ref >60.00)
Glucose, Bld: 91 mg/dL (ref 70–99)
Potassium: 4.2 mEq/L (ref 3.5–5.1)
Sodium: 139 mEq/L (ref 135–145)
Total Bilirubin: 0.5 mg/dL (ref 0.2–1.2)
Total Protein: 8.4 g/dL — ABNORMAL HIGH (ref 6.0–8.3)

## 2021-07-12 LAB — CBC
HCT: 41 % (ref 36.0–46.0)
Hemoglobin: 13.6 g/dL (ref 12.0–15.0)
MCHC: 33.2 g/dL (ref 30.0–36.0)
MCV: 93.9 fl (ref 78.0–100.0)
Platelets: 326 10*3/uL (ref 150.0–400.0)
RBC: 4.37 Mil/uL (ref 3.87–5.11)
RDW: 13.3 % (ref 11.5–15.5)
WBC: 4 10*3/uL (ref 4.0–10.5)

## 2021-07-12 LAB — LIPID PANEL
Cholesterol: 174 mg/dL (ref 0–200)
HDL: 47.3 mg/dL (ref >39.00)
LDL Cholesterol: 106 mg/dL — ABNORMAL HIGH (ref 0–99)
NonHDL: 127.09
Total CHOL/HDL Ratio: 4
Triglycerides: 107 mg/dL (ref 0.0–149.0)
VLDL: 21.4 mg/dL (ref 0.0–40.0)

## 2021-07-12 LAB — HEMOGLOBIN A1C: Hgb A1c MFr Bld: 5.5 % (ref 4.6–6.5)

## 2021-07-12 MED ORDER — VALSARTAN 80 MG PO TABS: 80.0000 mg | ORAL_TABLET | Freq: Every day | ORAL | 3 refills | Status: DC

## 2021-07-12 NOTE — Assessment & Plan Note (Signed)
EKG done with no changes consistent with ischemia or LVH. Checking CMP and CBC for complications. Rx valsartan 80 mg daily and return in 1 month. Counseled extensively on DASH diet as well as CV risk and the importance of good BP control.  ?

## 2021-07-12 NOTE — Progress Notes (Signed)
? ?  Subjective:  ? ?Patient ID: Susan Morton, female    DOB: 17-Aug-1970, 51 y.o.   MRN: [id number] ? ?HPI ?The patient is a 51 YO transfer of care female coming in for concerns. ? ?Review of Systems  ?Constitutional: Negative.   ?HENT: Negative.    ?Eyes: Negative.   ?Respiratory:  Negative for cough, chest tightness and shortness of breath.   ?Cardiovascular:  Negative for chest pain, palpitations and leg swelling.  ?Gastrointestinal:  Negative for abdominal distention, abdominal pain, constipation, diarrhea, nausea and vomiting.  ?Musculoskeletal: Negative.   ?Skin: Negative.   ?Neurological: Negative.   ?Psychiatric/Behavioral: Negative.    ? ?Objective:  ?Physical Exam ?Constitutional:   ?   Appearance: She is well-developed. She is obese.  ?HENT:  ?   Head: Normocephalic and atraumatic.  ?Cardiovascular:  ?   Rate and Rhythm: Normal rate and regular rhythm.  ?Pulmonary:  ?   Effort: Pulmonary effort is normal. No respiratory distress.  ?   Breath sounds: Normal breath sounds. No wheezing or rales.  ?Abdominal:  ?   General: Bowel sounds are normal. There is no distension.  ?   Palpations: Abdomen is soft.  ?   Tenderness: There is no abdominal tenderness. There is no rebound.  ?Musculoskeletal:  ?   Cervical back: Normal range of motion.  ?Skin: ?   General: Skin is warm and dry.  ?Neurological:  ?   Mental Status: She is alert and oriented to person, place, and time.  ?   Coordination: Coordination normal.  ? ? ?Vitals:  ? 07/12/21 1511 07/12/21 1517  ?BP: (!) 160/110 (!) 158/116  ?Pulse: 88   ?Temp: 97.8 ?F (36.6 ?C)   ?TempSrc: Oral   ?SpO2: 96%   ?Weight: 208 lb (94.3 kg)   ?Height: '5\' 1"'$  (1.549 m)   ? ?EKG: Rate 72, axis normal, interval normal, incomplete RBBB, sinus, no st or t wave changes, no prior to compare ? ? ?This visit occurred during the SARS-CoV-2 public health emergency.  Safety protocols were in place, including screening questions prior to the visit, additional usage of staff PPE, and  extensive cleaning of exam room while observing appropriate contact time as indicated for disinfecting solutions.  ? ?Assessment & Plan:  ?Visit time 30 minutes in face to face communication with patient and coordination of care, additional 15 minutes spent in record review, coordination or care, ordering tests, communicating/referring to other healthcare professionals, documenting in medical records all on the same day of the visit for total time 45 minutes spent on the visit.  ? ?

## 2021-07-12 NOTE — Assessment & Plan Note (Addendum)
Checking CBC as no follow up in some time. Treat as appropriate.  ?

## 2021-07-12 NOTE — Assessment & Plan Note (Addendum)
BMI 39 and complicated by hypertension and anemia and osteoarthritis. BP not controlled. Checking HgA1c and lipid panel for complications.  ?

## 2021-07-12 NOTE — Patient Instructions (Signed)
We have sent in valsartan 1 pill daily to help bring down the blood pressure. We will bring you back in a month or so.  ? ?Work on the diet also for about 3000 mg sodium per day. ? ? ?

## 2021-08-16 ENCOUNTER — Ambulatory Visit: Payer: BC Managed Care – PPO | Admitting: Internal Medicine

## 2021-09-27 ENCOUNTER — Ambulatory Visit: Payer: BC Managed Care – PPO | Admitting: Internal Medicine

## 2021-10-28 ENCOUNTER — Ambulatory Visit (INDEPENDENT_AMBULATORY_CARE_PROVIDER_SITE_OTHER): Payer: BC Managed Care – PPO | Admitting: Internal Medicine

## 2021-10-28 ENCOUNTER — Encounter: Payer: Self-pay | Admitting: Internal Medicine

## 2021-10-28 VITALS — BP 136/80 | HR 86 | Resp 18 | Ht 61.0 in | Wt 214.2 lb

## 2021-10-28 DIAGNOSIS — J3089 Other allergic rhinitis: Secondary | ICD-10-CM | POA: Diagnosis not present

## 2021-10-28 DIAGNOSIS — Z113 Encounter for screening for infections with a predominantly sexual mode of transmission: Secondary | ICD-10-CM

## 2021-10-28 DIAGNOSIS — I10 Essential (primary) hypertension: Secondary | ICD-10-CM | POA: Diagnosis not present

## 2021-10-28 NOTE — Progress Notes (Signed)
   Subjective:   Patient ID: Susan Morton, female    DOB: 10-12-1970, 51 y.o.   MRN: [id number]  HPI The patient is a 51 YO female coming in for BP check and sinuses (ran out of BP medication a week or so ago)  Review of Systems  Constitutional: Negative.  Negative for fatigue, fever and unexpected weight change.  HENT:  Positive for congestion, postnasal drip, rhinorrhea and sinus pressure. Negative for ear discharge, ear pain, sinus pain, sneezing, sore throat, tinnitus, trouble swallowing and voice change.   Eyes: Negative.   Respiratory:  Negative for cough, chest tightness, shortness of breath and wheezing.   Cardiovascular: Negative.  Negative for chest pain, palpitations and leg swelling.  Gastrointestinal: Negative.  Negative for abdominal distention, abdominal pain, constipation, diarrhea, nausea and vomiting.  Musculoskeletal: Negative.   Skin: Negative.   Neurological: Negative.   Psychiatric/Behavioral: Negative.      Objective:  Physical Exam Constitutional:      Appearance: She is well-developed. She is obese.  HENT:     Head: Normocephalic and atraumatic.     Comments: Oropharynx with redness and clear drainage, nose with swollen turbinates, TMs normal bilaterally.  Neck:     Thyroid: No thyromegaly.  Cardiovascular:     Rate and Rhythm: Normal rate and regular rhythm.  Pulmonary:     Effort: Pulmonary effort is normal. No respiratory distress.     Breath sounds: Normal breath sounds. No wheezing or rales.  Abdominal:     General: Bowel sounds are normal. There is no distension.     Palpations: Abdomen is soft.     Tenderness: There is no abdominal tenderness. There is no rebound.  Musculoskeletal:        General: No tenderness.     Cervical back: Normal range of motion.  Lymphadenopathy:     Cervical: No cervical adenopathy.  Skin:    General: Skin is warm and dry.  Neurological:     Mental Status: She is alert and oriented to person, place, and time.      Coordination: Coordination normal.     Vitals:   10/28/21 1439  BP: 136/80  Pulse: 86  Resp: 18  SpO2: 96%  Weight: 214 lb 3.2 oz (97.2 kg)  Height: '5\' 1"'$  (1.549 m)    Assessment & Plan:

## 2021-10-29 LAB — URINALYSIS, ROUTINE W REFLEX MICROSCOPIC
Bilirubin Urine: NEGATIVE
Ketones, ur: NEGATIVE
Nitrite: NEGATIVE
Specific Gravity, Urine: <=1.005 — AB (ref 1.000–1.030)
Total Protein, Urine: NEGATIVE
Urine Glucose: NEGATIVE
Urobilinogen, UA: 0.2 (ref 0.0–1.0)
pH: 6 (ref 5.0–8.0)

## 2021-10-30 DIAGNOSIS — Z113 Encounter for screening for infections with a predominantly sexual mode of transmission: Secondary | ICD-10-CM | POA: Insufficient documentation

## 2021-10-30 DIAGNOSIS — J309 Allergic rhinitis, unspecified: Secondary | ICD-10-CM | POA: Insufficient documentation

## 2021-10-30 LAB — GC/CHLAMYDIA PROBE AMP
Chlamydia trachomatis, NAA: NEGATIVE
Neisseria Gonorrhoeae by PCR: NEGATIVE

## 2021-10-30 NOTE — Assessment & Plan Note (Signed)
Desires screening, U/A, HIV, hep c, GC/chlamydia screening done today.

## 2021-10-30 NOTE — Assessment & Plan Note (Signed)
BP at goal off medication and she is dedicated to continuing lifestyle changes to help. She has a way to monitor and let us know if the BP is elevating we can resume valsartan 80 mg daily (had no side effects with this).

## 2021-10-30 NOTE — Assessment & Plan Note (Signed)
Advised safe to take zyrtec 10 mg daily otc and use cold medication which is safe for BP (examples given) if needed for cold symptoms. Given essential hypertension she should avoid medications which have agents that can raise BP.

## 2021-12-19 IMAGING — DX DG CHEST 2V
2 series · 2 of 2 positions shown · non-contrast
Comparison: None.

CLINICAL DATA: History of mediastinal lymphadenopathy

EXAM:
CHEST - 2 VIEW

[chest pa]
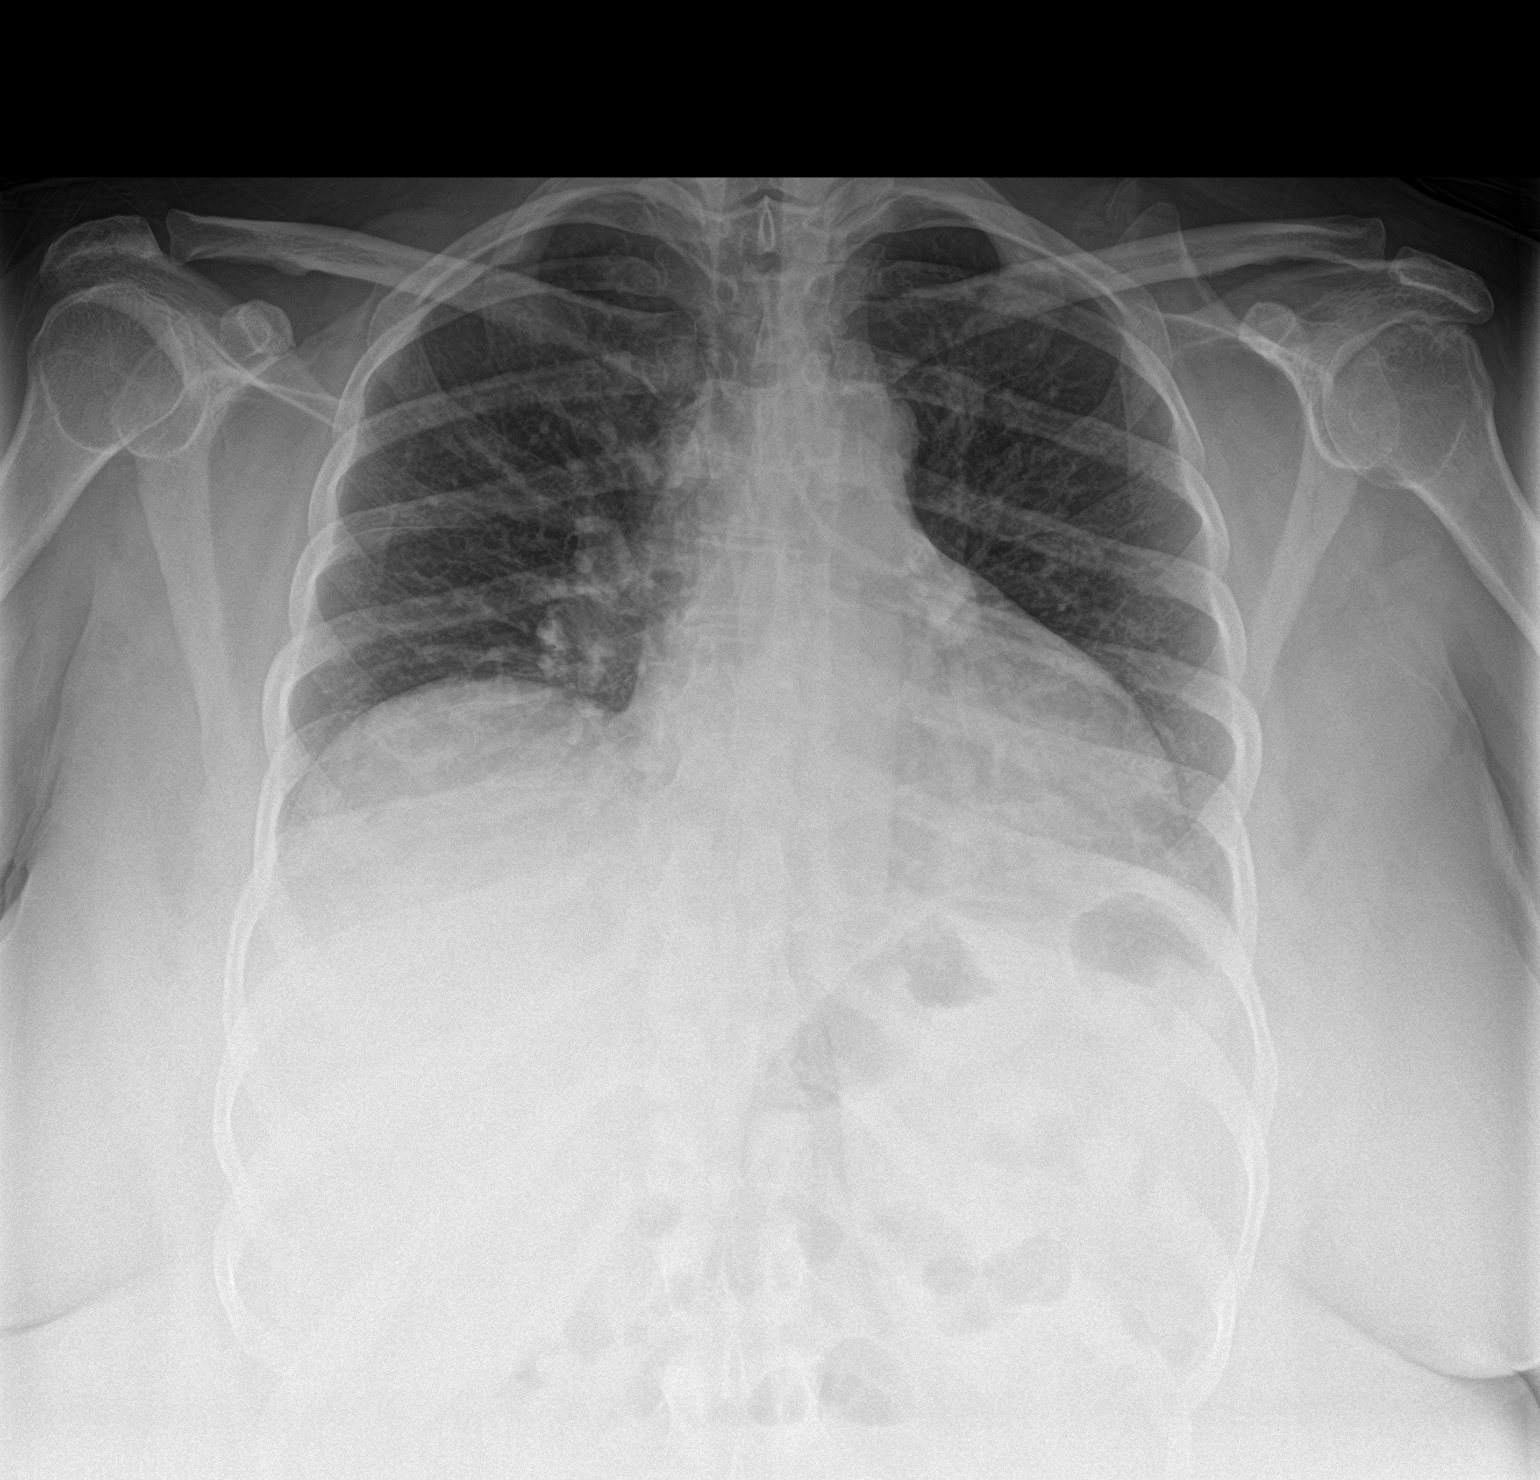

[chest lat]
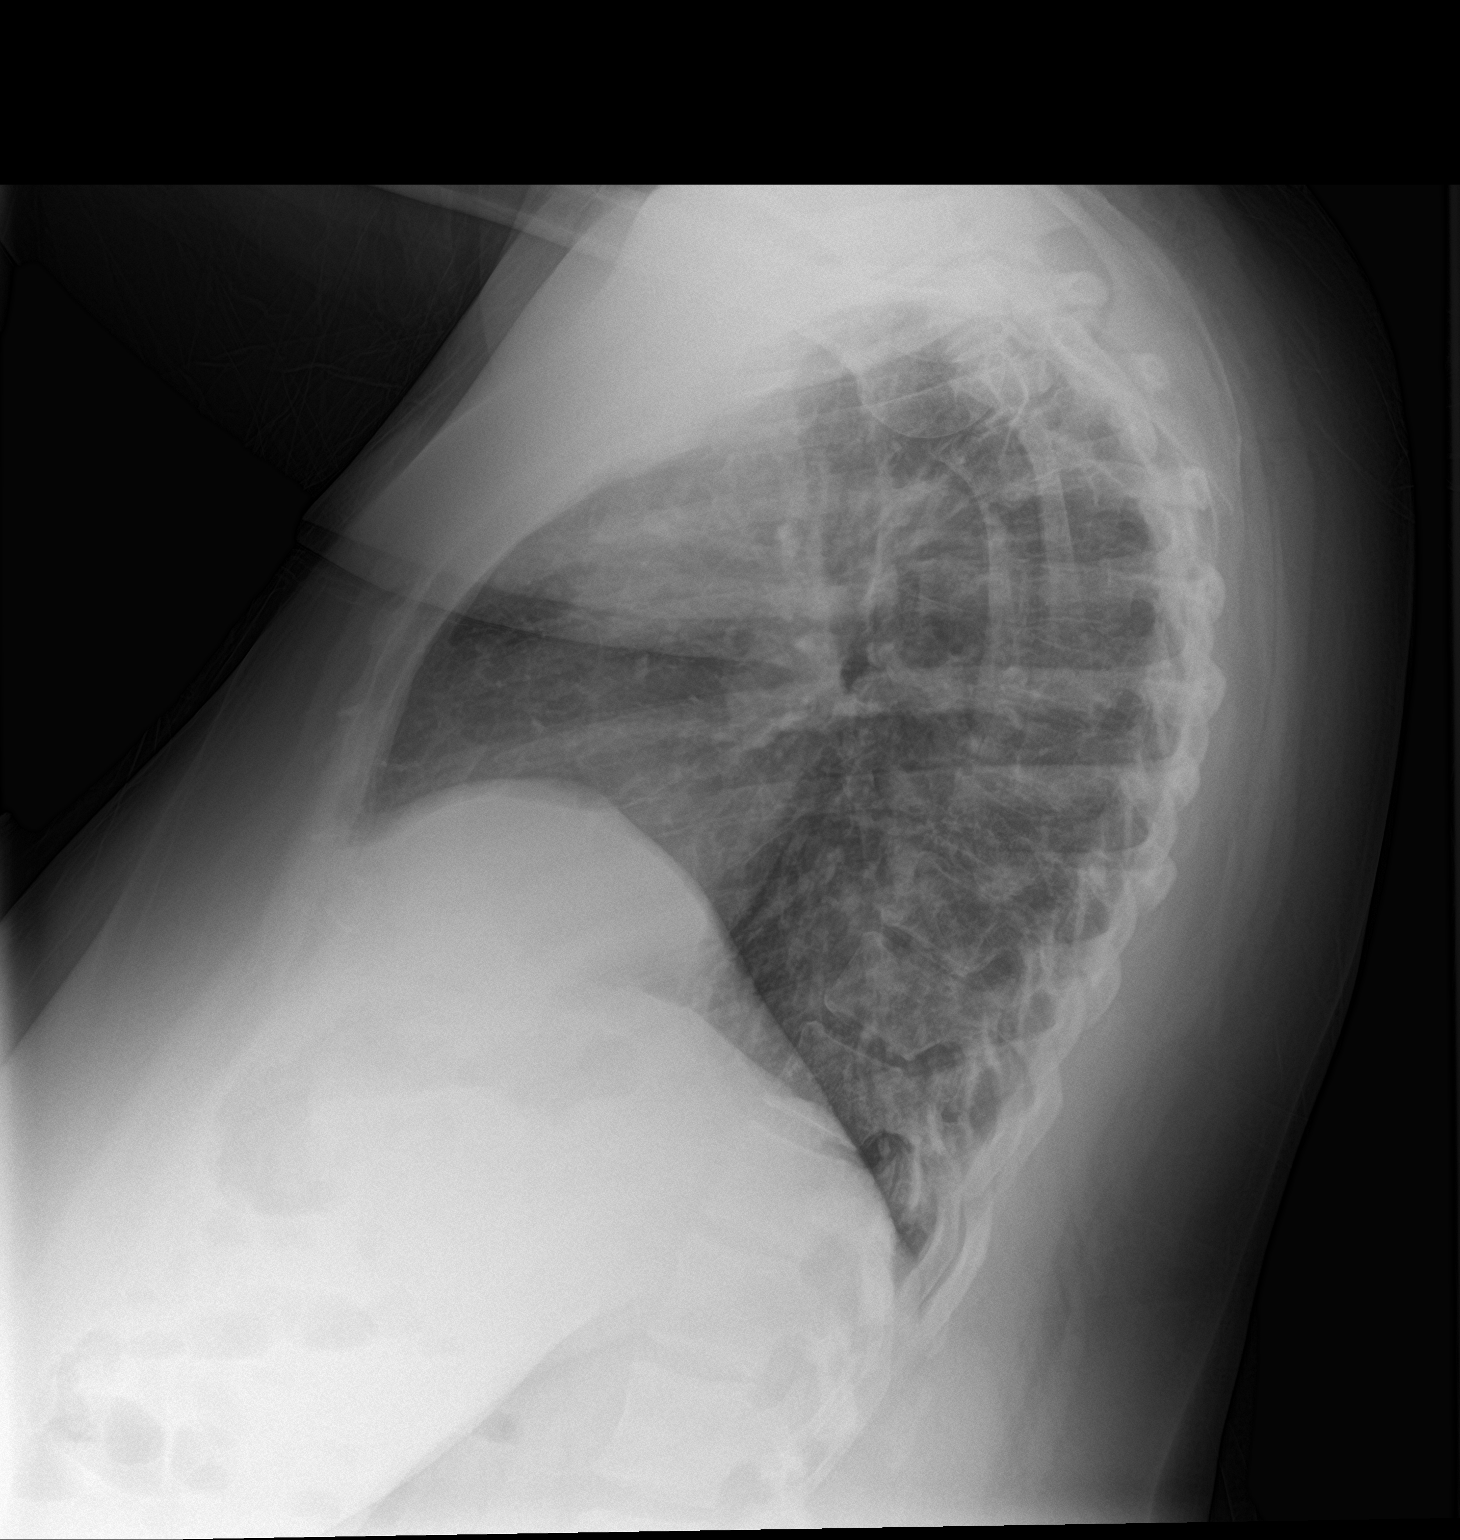

[2 of 2 positions shown; findings below may reference images not displayed]

FINDINGS: No focal consolidation. No pleural effusion or pneumothorax.
Prominence of the right hilum which may reflect prominent pulmonary
vasculature versus lymphadenopathy. Heart size is normal.

No acute osseous abnormality.
IMPRESSION: Prominence of the right hilum which may reflect prominent pulmonary
vasculature versus lymphadenopathy.

No acute cardiopulmonary disease.

## 2023-01-20 ENCOUNTER — Emergency Department (HOSPITAL_BASED_OUTPATIENT_CLINIC_OR_DEPARTMENT_OTHER)
Admission: EM | Admit: 2023-01-20 | Discharge: 2023-01-20 | Disposition: A | Discharge status: Home / Self Care | Payer: BC Managed Care – PPO | Attending: Emergency Medicine | Admitting: Emergency Medicine

## 2023-01-20 ENCOUNTER — Emergency Department (HOSPITAL_BASED_OUTPATIENT_CLINIC_OR_DEPARTMENT_OTHER): Payer: BC Managed Care – PPO

## 2023-01-20 ENCOUNTER — Ambulatory Visit: Payer: BC Managed Care – PPO | Admitting: Internal Medicine

## 2023-01-20 ENCOUNTER — Emergency Department (HOSPITAL_BASED_OUTPATIENT_CLINIC_OR_DEPARTMENT_OTHER): Payer: BC Managed Care – PPO | Admitting: Radiology

## 2023-01-20 ENCOUNTER — Encounter: Payer: Self-pay | Admitting: Internal Medicine

## 2023-01-20 ENCOUNTER — Other Ambulatory Visit: Payer: Self-pay

## 2023-01-20 DIAGNOSIS — I10 Essential (primary) hypertension: Secondary | ICD-10-CM | POA: Diagnosis present

## 2023-01-20 DIAGNOSIS — Z79899 Other long term (current) drug therapy: Secondary | ICD-10-CM | POA: Insufficient documentation

## 2023-01-20 LAB — URINALYSIS, ROUTINE W REFLEX MICROSCOPIC
Bacteria, UA: NONE SEEN
Bilirubin Urine: NEGATIVE
Glucose, UA: NEGATIVE mg/dL
Ketones, ur: 15 mg/dL — AB
Nitrite: NEGATIVE
Specific Gravity, Urine: 1.024 (ref 1.005–1.030)
pH: 6 (ref 5.0–8.0)

## 2023-01-20 LAB — CBC
HCT: 42.1 % (ref 36.0–46.0)
Hemoglobin: 13.5 g/dL (ref 12.0–15.0)
MCH: 30.6 pg (ref 26.0–34.0)
MCHC: 32.1 g/dL (ref 30.0–36.0)
MCV: 95.5 fL (ref 80.0–100.0)
Platelets: 305 10*3/uL (ref 150–400)
RBC: 4.41 MIL/uL (ref 3.87–5.11)
RDW: 12.9 % (ref 11.5–15.5)
WBC: 4 10*3/uL (ref 4.0–10.5)
nRBC: 0 % (ref 0.0–0.2)

## 2023-01-20 LAB — BASIC METABOLIC PANEL
Anion gap: 11 (ref 5–15)
BUN: 17 mg/dL (ref 6–20)
CO2: 25 mmol/L (ref 22–32)
Calcium: 9.4 mg/dL (ref 8.9–10.3)
Chloride: 104 mmol/L (ref 98–111)
Creatinine, Ser: 0.79 mg/dL (ref 0.44–1.00)
GFR, Estimated: >60 mL/min (ref >60)
Glucose, Bld: 98 mg/dL (ref 70–99)
Potassium: 3.7 mmol/L (ref 3.5–5.1)
Sodium: 140 mmol/L (ref 135–145)

## 2023-01-20 LAB — TROPONIN I (HIGH SENSITIVITY)
Troponin I (High Sensitivity): 6 ng/L (ref <18)
Troponin I (High Sensitivity): 7 ng/L (ref <18)

## 2023-01-20 MED ORDER — AMLODIPINE BESYLATE 5 MG PO TABS
5.0000 mg | ORAL_TABLET | Freq: Every day | ORAL | 0 refills | Status: DC
Start: 2023-01-20 — End: 2023-02-25

## 2023-01-20 NOTE — ED Triage Notes (Signed)
Dizzy x2 weeks. HTN in past- has refused meds- 'does not believe in medicine.' Now arrives anxious and worried today over dizzy spells. Wants to take blood pressure medicine now. No vision changes. No headaches. No falls. Denies SOB. Has had instances of flush feeling in chest over past several weeks as well.

## 2023-01-20 NOTE — Discharge Instructions (Addendum)
Please follow-up with your primary care provider recent and ER visit.  Today your labs and imaging were reassuring and your symptoms are most likely related to your high blood pressure.  I have prescribed you 5 mg of amlodipine to take for your blood pressure however you will need to follow-up with your primary care provider for long-term management.  If symptoms change or worsen please return to ER.

## 2023-01-20 NOTE — ED Provider Triage Note (Signed)
Emergency Medicine Provider Triage Evaluation Note  Susan Morton , a 52 y.o. female  was evaluated in triage.  Pt complains of dizzy HTN. Does not take HTN BP as she does not like medications. Has not taken meds for a few weeks. Dizzy for past few days. NO vision changes, CP, SHOB. Feels unstable when walking.  Review of Systems  Positive: See PHI Negative: See HPI  Physical Exam  BP (!) 226/131 (BP Location: Right Arm)   Pulse (!) 108   Temp 98 F (36.7 C) (Oral)   Resp 18   Ht 5' (1.524 m)   Wt 97.1 kg   SpO2 98%   BMI 41.79 kg/m  Gen:   Awake, no distress   Resp:  Normal effort  MSK:   Moves extremities without difficulty  Other:  Able to ambulate without difficulty, finger to nose normal, vision grossly intact, pupils perrl  Medical Decision Making  Medically screening exam initiated at 5:04 PM.  Appropriate orders placed.  ARIYEL DALSING was informed that the remainder of the evaluation will be completed by another provider, this initial triage assessment does not replace that evaluation, and the importance of remaining in the ED until their evaluation is complete.  Workup started, pt stable, suspect HTN urgency   Netta Corrigan, PA-C 01/20/23 1707

## 2023-01-20 NOTE — ED Provider Notes (Signed)
[place] EMERGENCY DEPARTMENT AT [facility] Provider Note   CSN: [id number] Arrival date & time: 01/20/23  1513     History  Chief Complaint  Patient presents with   Chest Pain    LEMON SUPPA is a 52 y.o. female history of hypertension presented for dizziness and high blood pressure.  Patient states she does not like taking her blood pressure meds has not taken them for some weeks.  Past few days patient has endorsed dizziness in which she feels unstable intermittently but denies any chest pain, shortness of breath, neck pain, vision changes.  Patient denies weakness, change in sensation/motor skills, slurring of words during these episodes.  Patient denies any trauma to the head.    Home Medications Prior to Admission medications   Medication Sig Start Date End Date Taking? Authorizing Provider  amLODipine (NORVASC) 5 MG tablet Take 1 tablet (5 mg total) by mouth daily. 01/20/23 02/19/23 Yes Netta Corrigan, PA-C      Allergies    Patient has no known allergies.    Review of Systems   Review of Systems  Cardiovascular:  Positive for chest pain.    Physical Exam Updated Vital Signs BP (!) 190/106   Pulse 84   Temp 98 F (36.7 C) (Oral)   Resp (!) 22   Ht 5' (1.524 m)   Wt 97.1 kg   SpO2 99%   BMI 41.79 kg/m  Physical Exam Vitals reviewed.  Constitutional:      General: She is not in acute distress. HENT:     Head: Normocephalic and atraumatic.  Eyes:     Extraocular Movements: Extraocular movements intact.     Conjunctiva/sclera: Conjunctivae normal.     Pupils: Pupils are equal, round, and reactive to light.  Cardiovascular:     Rate and Rhythm: Normal rate and regular rhythm.     Pulses: Normal pulses.     Heart sounds: Normal heart sounds.     Comments: 2+ bilateral radial/dorsalis pedis pulses with regular rate Pulmonary:     Effort: Pulmonary effort is normal. No respiratory distress.     Breath sounds: Normal breath sounds.   Abdominal:     Palpations: Abdomen is soft.     Tenderness: There is no abdominal tenderness. There is no guarding or rebound.  Musculoskeletal:        General: Normal range of motion.     Cervical back: Normal range of motion and neck supple.     Comments: 5 out of 5 bilateral grip/leg extension strength  Skin:    General: Skin is warm and dry.     Capillary Refill: Capillary refill takes less than 2 seconds.  Neurological:     General: No focal deficit present.     Mental Status: She is alert and oriented to person, place, and time.     Sensory: Sensation is intact.     Motor: Motor function is intact.     Coordination: Coordination is intact.     Gait: Gait is intact.     Comments: Sensation intact in all 4 limbs Vision grossly intact Cranial nerves III through XII intact  Psychiatric:        Mood and Affect: Mood normal.     ED Results / Procedures / Treatments   Labs (all labs ordered are listed, but only abnormal results are displayed) Labs Reviewed  URINALYSIS, ROUTINE W REFLEX MICROSCOPIC - Abnormal; Notable for the following components:      Result  Value   Hgb urine dipstick SMALL (*)    Ketones, ur 15 (*)    Protein, ur TRACE (*)    Leukocytes,Ua TRACE (*)    All other components within normal limits  BASIC METABOLIC PANEL  CBC  TROPONIN I (HIGH SENSITIVITY)  TROPONIN I (HIGH SENSITIVITY)    EKG EKG Interpretation Date/Time:  Tuesday January 20 2023 15:46:09 EDT Ventricular Rate:  91 PR Interval:  148 QRS Duration:  110 QT Interval:  360 QTC Calculation: 442 R Axis:   36  Text Interpretation: Normal sinus rhythm Incomplete right bundle branch block Anterior infarct , age undetermined Abnormal ECG No previous ECGs available Confirmed by Jacalyn Lefevre [phone]) on 01/20/2023 3:48:51 PM  Radiology CT Head Wo Contrast  Result Date: 01/20/2023 CLINICAL DATA:  Hypertension and dizziness, initial encounter EXAM: CT HEAD WITHOUT CONTRAST TECHNIQUE:  Contiguous axial images were obtained from the base of the skull through the vertex without intravenous contrast. RADIATION DOSE REDUCTION: This exam was performed according to the departmental dose-optimization program which includes automated exposure control, adjustment of the mA and/or kV according to patient size and/or use of iterative reconstruction technique. COMPARISON:  None Available. FINDINGS: Brain: No evidence of acute infarction, hemorrhage, hydrocephalus, extra-axial collection or mass lesion/mass effect. Mild atrophic changes are noted. Vascular: No hyperdense vessel or unexpected calcification. Skull: Normal. Negative for fracture or focal lesion. Sinuses/Orbits: No acute finding. Other: None. IMPRESSION: Mild atrophic changes without acute abnormality. Electronically Signed   By: Alcide Clever M.D.   On: 01/20/2023 19:32   DG Chest 2 View  Result Date: 01/20/2023 CLINICAL DATA:  Chest pain.  Dizziness EXAM: CHEST - 2 VIEW COMPARISON:  04/25/2020 FINDINGS: The heart size and mediastinal contours are within normal limits. Eventration of the right hemidiaphragm. No consolidation, pneumothorax, effusion. No edema. The visualized skeletal structures are unremarkable. IMPRESSION: No acute cardiopulmonary disease. Electronically Signed   By: Karen Kays M.D.   On: 01/20/2023 17:23    Procedures .Critical Care  Performed by: Netta Corrigan, PA-C Authorized by: Netta Corrigan, PA-C   Critical care provider statement:    Critical care time (minutes):  30   Critical care time was exclusive of:  Separately billable procedures and treating other patients   Critical care was necessary to treat or prevent imminent or life-threatening deterioration of the following conditions: HTN.   Critical care was time spent personally by me on the following activities:  Blood draw for specimens, development of treatment plan with patient or surrogate, evaluation of patient's response to treatment,  examination of patient, obtaining history from patient or surrogate, review of old charts, re-evaluation of patient's condition, ordering and review of radiographic studies, pulse oximetry and ordering and review of laboratory studies   I assumed direction of critical care for this patient from another provider in my specialty: no       Medications Ordered in ED Medications - No data to display  ED Course/ Medical Decision Making/ A&P                                 Medical Decision Making Amount and/or Complexity of Data Reviewed Labs: ordered. Radiology: ordered.  Risk Prescription drug management.   Vicie Mutters 52 y.o. presented today for elevated HTN, dizzy. Working DDx that I considered at this time includes, but not limited to, uncontrolled hypertension, HTN urgency/emergency, intracranial hemorrhage, acute renal artery stenosis, acute kidney  injury, ACS, ophthalmologic emergencies, ICH, subdural/epidural hematoma, CVA/TIA.  R/o DDx: HTN urgency/emergency, intracranial hemorrhage, acute renal artery stenosis, acute kidney injury, ACS, ophthalmologic emergencies, ICH, subdural/epidural hematoma, CVA/TIA: These are considered less likely due to history of present illness, physical exam, labs/imaging findings  Review of prior external notes: 01/20/2023 and care  Unique Tests and My Interpretation:  CBC: Unremarkable BMP: Unremarkable CXR: No acute cardiopulmonary changes Troponin: 6, 7 EKG: Sinus 91 bpm, incomplete right bundle branch block, no ST elevation or depression noted UA: Small hemoglobin, trace proteins, 15 ketones CT Head w/o Contrast: No acute changes  Discussion with Independent Historian: None  Discussion of Management of Tests: None  Risk: Medium: prescription drug management  Risk Stratification Score: None  Staffed with [place], MD  Plan: On exam patient was in no acute distress but was noted to be hypertensive at 226/131 and mildly tachycardic  at 108.  Patient did appear anxious on exam which may account for the mild tachycardia however patient says she has not been taking her blood pressure meds for the past few weeks which I suspect is why patient is hypertensive.  Upon chart review I cannot find the medications patient is supposed to be taking for hypertension.  Patient had reassuring neurologic exam in triage room along with reassuring labs however was endorsing dizziness and so we will obtain CT.  Suspect hypertensive urgency and anticipate hydralazine as patient does have incomplete right bundle branch block on EKG and cannot give labetalol.  CT was reassuring.  Patient's labs also reassuring.  Upon reassessment patient stated that she has not been having any symptoms since she got to the ER and patient's blood pressure in the room was 190/106.  Suspect patient lives in the 200s and do not want to drop patient's blood pressure even more due to risk of CVA.  I spoke to the patient and we had shared decision making in which the patient will be discharged on amlodipine 5 mg and to follow-up with her primary care provider for long-term management of her blood pressure.  I spoke to the patient extensively about the risks of letting her blood pressure run high including CVA, heart attack, kidney damage, etc. and patient verbalized understanding.  Patient was given return precautions. Patient stable for discharge at this time.  Patient verbalized understanding of plan.         Final Clinical Impression(s) / ED Diagnoses Final diagnoses:  Uncontrolled hypertension    Rx / DC Orders ED Discharge Orders          Ordered    amLODipine (NORVASC) 5 MG tablet  Daily        01/20/23 2000              Remi Deter 01/20/23 2009    Jacalyn Lefevre, MD 01/20/23 2209

## 2023-02-23 ENCOUNTER — Telehealth: Payer: Self-pay | Admitting: Internal Medicine

## 2023-02-23 NOTE — Telephone Encounter (Signed)
Prescription Request  02/23/2023  LOV: Visit date not found  What is the name of the medication or equipment? amlodopine  Have you contacted your pharmacy to request a refill? Yes   Which pharmacy would you like this sent to?  [id number] [place], [place] - [address] [place] [address] Phone: [phone] Fax: [phone]    Patient notified that their request is being sent to the clinical staff for review and that they should receive a response within 2 business days.   Please advise at Mobile [phone] (mobile)

## 2023-02-23 NOTE — Telephone Encounter (Signed)
28th is not urgent needs within 24 hours please I see many slots in the office this week.

## 2023-02-23 NOTE — Telephone Encounter (Signed)
Ok to refill 

## 2023-02-23 NOTE — Telephone Encounter (Signed)
I have rescheduled her for the 28 th

## 2023-02-23 NOTE — Telephone Encounter (Signed)
Please call patient and let her know if Dr. Okey Dupre will send enough till her appointment.  She is completely out - has not take for 4 days.  [phone]

## 2023-02-23 NOTE — Telephone Encounter (Signed)
Patient called because she is out of this medication - amlodopine - [facility] to [facility] [address] - [place], [place]

## 2023-02-23 NOTE — Telephone Encounter (Signed)
Needs urgent visit with anyone for refill. This was started by ER for high blood pressure and she did not follow up.

## 2023-02-24 NOTE — Progress Notes (Deleted)
    Subjective:    Patient ID: Susan Morton, female    DOB: December 26, 1970, 51 y.o.   MRN: [id number]      HPI Susan Morton is here for No chief complaint on file.   9/17 - ED for dizziness - presented with dizziness, high BP.  She does not like taking her BP meds and had not taken them for weeks.  Had dizziness x few days and felt unstable.  No CP, SOB, neck pain, blurry vision, weakness.    BP 226/131 ->190/106.  EKG NSR 91 bpm, incomplete RBBB, ? anterior infarct.  Exam normal.  Ct head neg for acute abn.  Bmp, cbc, trop x 2 and cxr normal.    Started on amlodipine 5 mg daily.       Medications and allergies reviewed with patient and updated if appropriate.  Current Outpatient Medications on File Prior to Visit  Medication Sig Dispense Refill   amLODipine (NORVASC) 5 MG tablet Take 1 tablet (5 mg total) by mouth daily. 30 tablet 0   No current facility-administered medications on file prior to visit.    Review of Systems     Objective:  There were no vitals filed for this visit. BP Readings from Last 3 Encounters:  01/20/23 (!) 190/106  10/28/21 136/80  07/12/21 (!) 158/116   Wt Readings from Last 3 Encounters:  01/20/23 214 lb (97.1 kg)  10/28/21 214 lb 3.2 oz (97.2 kg)  07/12/21 208 lb (94.3 kg)   There is no height or weight on file to calculate BMI.    Physical Exam         Assessment & Plan:    See Problem List for Assessment and Plan of chronic medical problems.

## 2023-02-25 ENCOUNTER — Encounter: Payer: Self-pay | Admitting: Internal Medicine

## 2023-02-25 ENCOUNTER — Ambulatory Visit: Payer: BC Managed Care – PPO | Admitting: Internal Medicine

## 2023-02-25 VITALS — BP 140/100 | HR 82 | Temp 98.5°F | Ht 60.0 in | Wt 213.0 lb

## 2023-02-25 DIAGNOSIS — I10 Essential (primary) hypertension: Secondary | ICD-10-CM

## 2023-02-25 MED ORDER — AMLODIPINE BESYLATE 5 MG PO TABS: 5.0000 mg | ORAL_TABLET | Freq: Every day | ORAL | 5 refills | Status: DC

## 2023-02-25 MED ORDER — BD ASSURE BPM/AUTO ARM CUFF MISC: 0 refills | Status: AC

## 2023-02-25 NOTE — Patient Instructions (Addendum)
BP goal < 130/80    Medications changes include :   continue amlodipine 5 mg daily     Return for 3-4 weeks with PCP for htn.     Hypertension, Adult High blood pressure (hypertension) is when the force of blood pumping through the arteries is too strong. The arteries are the blood vessels that carry blood from the heart throughout the body. Hypertension forces the heart to work harder to pump blood and may cause arteries to become narrow or stiff. Untreated or uncontrolled hypertension can lead to a heart attack, heart failure, a stroke, kidney disease, and other problems. A blood pressure reading consists of a higher number over a lower number. Ideally, your blood pressure should be below 120/80. The first ("top") number is called the systolic pressure. It is a measure of the pressure in your arteries as your heart beats. The second ("bottom") number is called the diastolic pressure. It is a measure of the pressure in your arteries as the heart relaxes. What are the causes? The exact cause of this condition is not known. There are some conditions that result in high blood pressure. What increases the risk? Certain factors may make you more likely to develop high blood pressure. Some of these risk factors are under your control, including: Smoking. Not getting enough exercise or physical activity. Being overweight. Having too much fat, sugar, calories, or salt (sodium) in your diet. Drinking too much alcohol. Other risk factors include: Having a personal history of heart disease, diabetes, high cholesterol, or kidney disease. Stress. Having a family history of high blood pressure and high cholesterol. Having obstructive sleep apnea. Age. The risk increases with age. What are the signs or symptoms? High blood pressure may not cause symptoms. Very high blood pressure (hypertensive crisis) may cause: Headache. Fast or irregular heartbeats (palpitations). Shortness of  breath. Nosebleed. Nausea and vomiting. Vision changes. Severe chest pain, dizziness, and seizures. How is this diagnosed? This condition is diagnosed by measuring your blood pressure while you are seated, with your arm resting on a flat surface, your legs uncrossed, and your feet flat on the floor. The cuff of the blood pressure monitor will be placed directly against the skin of your upper arm at the level of your heart. Blood pressure should be measured at least twice using the same arm. Certain conditions can cause a difference in blood pressure between your right and left arms. If you have a high blood pressure reading during one visit or you have normal blood pressure with other risk factors, you may be asked to: Return on a different day to have your blood pressure checked again. Monitor your blood pressure at home for 1 week or longer. If you are diagnosed with hypertension, you may have other blood or imaging tests to help your health care provider understand your overall risk for other conditions. How is this treated? This condition is treated by making healthy lifestyle changes, such as eating healthy foods, exercising more, and reducing your alcohol intake. You may be referred for counseling on a healthy diet and physical activity. Your health care provider may prescribe medicine if lifestyle changes are not enough to get your blood pressure under control and if: Your systolic blood pressure is above 130. Your diastolic blood pressure is above 80. Your personal target blood pressure may vary depending on your medical conditions, your age, and other factors. Follow these instructions at home: Eating and drinking  Eat a diet that is high  in fiber and potassium, and low in sodium, added sugar, and fat. An example of this eating plan is called the DASH diet. DASH stands for Dietary Approaches to Stop Hypertension. To eat this way: Eat plenty of fresh fruits and vegetables. Try to fill  one half of your plate at each meal with fruits and vegetables. Eat whole grains, such as whole-wheat pasta, brown rice, or whole-grain bread. Fill about one fourth of your plate with whole grains. Eat or drink low-fat dairy products, such as skim milk or low-fat yogurt. Avoid fatty cuts of meat, processed or cured meats, and poultry with skin. Fill about one fourth of your plate with lean proteins, such as fish, chicken without skin, beans, eggs, or tofu. Avoid pre-made and processed foods. These tend to be higher in sodium, added sugar, and fat. Reduce your daily sodium intake. Many people with hypertension should eat less than 1,500 mg of sodium a day. Do not drink alcohol if: Your health care provider tells you not to drink. You are pregnant, may be pregnant, or are planning to become pregnant. If you drink alcohol: Limit how much you have to: 0-1 drink a day for women. 0-2 drinks a day for men. Know how much alcohol is in your drink. In the [place] one drink equals one 12 oz bottle of beer (355 mL), one 5 oz glass of wine (148 mL), or one 1 oz glass of hard liquor (44 mL). Lifestyle  Work with your health care provider to maintain a healthy body weight or to lose weight. Ask what an ideal weight is for you. Get at least 30 minutes of exercise that causes your heart to beat faster (aerobic exercise) most days of the week. Activities may include walking, swimming, or biking. Include exercise to strengthen your muscles (resistance exercise), such as Pilates or lifting weights, as part of your weekly exercise routine. Try to do these types of exercises for 30 minutes at least 3 days a week. Do not use any products that contain nicotine or tobacco. These products include cigarettes, chewing tobacco, and vaping devices, such as e-cigarettes. If you need help quitting, ask your health care provider. Monitor your blood pressure at home as told by your health care provider. Keep all follow-up visits.  This is important. Medicines Take over-the-counter and prescription medicines only as told by your health care provider. Follow directions carefully. Blood pressure medicines must be taken as prescribed. Do not skip doses of blood pressure medicine. Doing this puts you at risk for problems and can make the medicine less effective. Ask your health care provider about side effects or reactions to medicines that you should watch for. Contact a health care provider if you: Think you are having a reaction to a medicine you are taking. Have headaches that keep coming back (recurring). Feel dizzy. Have swelling in your ankles. Have trouble with your vision. Get help right away if you: Develop a severe headache or confusion. Have unusual weakness or numbness. Feel faint. Have severe pain in your chest or abdomen. Vomit repeatedly. Have trouble breathing. These symptoms may be an emergency. Get help right away. Call 911. Do not wait to see if the symptoms will go away. Do not drive yourself to the hospital. Summary Hypertension is when the force of blood pumping through your arteries is too strong. If this condition is not controlled, it may put you at risk for serious complications. Your personal target blood pressure may vary depending on your medical conditions,  your age, and other factors. For most people, a normal blood pressure is less than 120/80. Hypertension is treated with lifestyle changes, medicines, or a combination of both. Lifestyle changes include losing weight, eating a healthy, low-sodium diet, exercising more, and limiting alcohol. This information is not intended to replace advice given to you by your health care provider. Make sure you discuss any questions you have with your health care provider. Document Revised: 02/26/2021 Document Reviewed: 02/26/2021 Elsevier Patient Education  2024 [employer].

## 2023-02-25 NOTE — Assessment & Plan Note (Signed)
Chronic Not controlled Ran out of amlodipine 5 mg daily - needs refill Refill amlodipine 5 mg daily today Monitor BP at home - rx for a BP cuff sent to pharmacy Advised low salt diet, regular exercise ( she is walking) She is working on weight loss Discussed consequences of uncontrolled blood pressure-she has come to terms with the fact that she needs to be taking her medication regularly Discussed that she may need additional medication to keep her blood pressure well-controlled Follow-up with PCP in 3-4 weeks

## 2023-02-25 NOTE — Progress Notes (Signed)
    Subjective:    Patient ID: Susan Morton, female    DOB: 1970/12/09, 52 y.o.   MRN: [id number]      HPI [facility] is here for  Chief Complaint  Patient presents with   Hypertension    9/17 - ED for dizziness - presented with dizziness, high BP.  She does not like taking her BP meds and had not taken them for weeks.  Had dizziness x few days and felt unstable.  No CP, SOB, neck pain, blurry vision, weakness.    BP 226/131 ->190/106.  EKG NSR 91 bpm, incomplete RBBB, ? anterior infarct.  Exam normal.  Ct head neg for acute abn.  Bmp, cbc, trop x 2 and cxr normal.    Started on amlodipine 5 mg daily.  She has been taking her medication daily but has not taken it in 5 days-she ran out of the medication and was not sure how to get a refill..  She has been tolerating medication well.   Walking regularly.  Has not drank any wine ( was having one glass a day)  Medications and allergies reviewed with patient and updated if appropriate.  Current Outpatient Medications on File Prior to Visit  Medication Sig Dispense Refill   amLODipine (NORVASC) 5 MG tablet Take 1 tablet (5 mg total) by mouth daily. 30 tablet 0   No current facility-administered medications on file prior to visit.    Review of Systems  Constitutional:  Negative for fever.  Respiratory:  Negative for cough, shortness of breath and wheezing.   Cardiovascular:  Negative for chest pain, palpitations and leg swelling.  Neurological:  Negative for light-headedness and headaches.      Objective:   Vitals:   02/25/23 1506  BP: (!) 156/90  Pulse: 82  Temp: 98.5 F (36.9 C)  SpO2: 100%   BP Readings from Last 3 Encounters:  02/25/23 (!) 156/90  01/20/23 (!) 190/106  10/28/21 136/80   Wt Readings from Last 3 Encounters:  02/25/23 213 lb (96.6 kg)  01/20/23 214 lb (97.1 kg)  10/28/21 214 lb 3.2 oz (97.2 kg)   Body mass index is 41.6 kg/m.    Physical Exam Constitutional:      General: She is not in acute  distress.    Appearance: Normal appearance.  HENT:     Head: Normocephalic and atraumatic.  Eyes:     Conjunctiva/sclera: Conjunctivae normal.  Cardiovascular:     Rate and Rhythm: Normal rate and regular rhythm.     Heart sounds: Normal heart sounds.  Pulmonary:     Effort: Pulmonary effort is normal. No respiratory distress.     Breath sounds: Normal breath sounds. No wheezing.  Musculoskeletal:     Cervical back: Neck supple.     Right lower leg: No edema.     Left lower leg: No edema.  Lymphadenopathy:     Cervical: No cervical adenopathy.  Skin:    General: Skin is warm and dry.     Findings: No rash.  Neurological:     Mental Status: She is alert. Mental status is at baseline.  Psychiatric:        Mood and Affect: Mood normal.        Behavior: Behavior normal.            Assessment & Plan:    See Problem List for Assessment and Plan of chronic medical problems.

## 2023-03-02 ENCOUNTER — Ambulatory Visit: Payer: BC Managed Care – PPO | Admitting: Internal Medicine

## 2023-03-16 ENCOUNTER — Ambulatory Visit: Payer: BC Managed Care – PPO | Admitting: Internal Medicine

## 2023-03-17 ENCOUNTER — Ambulatory Visit: Payer: BC Managed Care – PPO | Admitting: Internal Medicine

## 2023-04-23 LAB — EXTERNAL GENERIC LAB PROCEDURE: COLOGUARD: NEGATIVE

## 2023-04-23 LAB — COLOGUARD: COLOGUARD: NEGATIVE

## 2023-05-14 ENCOUNTER — Ambulatory Visit: Payer: Self-pay | Admitting: Family Medicine

## 2023-08-05 ENCOUNTER — Telehealth: Payer: Self-pay | Admitting: Internal Medicine

## 2023-08-05 NOTE — Telephone Encounter (Signed)
 Copied from CRM [id number]. Topic: Clinical - Medication Question >> Aug 05, 2023  1:41 PM Taleah C wrote: Reason for CRM: pt called to ask if her blood pressure medication can be switched to 30 day supply instead of 90 days due to financial reasons. Please call and advise.

## 2023-08-10 NOTE — Telephone Encounter (Signed)
 Yes needs visit for refills. But she can always fill 30 day even if 90 day is at pharmacy she just has to ask them

## 2023-08-10 NOTE — Telephone Encounter (Signed)
 Patient was suppose to do a follow up with you in 3-4 weeks call and schedule

## 2023-08-13 NOTE — Telephone Encounter (Signed)
 Called patient and informed her of this on voicemail

## 2023-08-26 ENCOUNTER — Other Ambulatory Visit: Payer: Self-pay | Admitting: Internal Medicine

## 2024-05-04 ENCOUNTER — Other Ambulatory Visit: Payer: Self-pay | Admitting: Family

## 2024-05-04 DIAGNOSIS — E785 Hyperlipidemia, unspecified: Secondary | ICD-10-CM
